# Patient Record
Sex: Female | Born: 1995 | Race: White | Hispanic: No | Marital: Single | Smoking: Former smoker
Health system: Southern US, Community
[De-identification: ages and names within clinical notes are randomized; demographics above are authoritative.]

## PROBLEM LIST (undated history)

## (undated) DIAGNOSIS — L301 Dyshidrosis [pompholyx]: Secondary | ICD-10-CM

## (undated) DIAGNOSIS — I341 Nonrheumatic mitral (valve) prolapse: Secondary | ICD-10-CM

## (undated) DIAGNOSIS — R000 Tachycardia, unspecified: Secondary | ICD-10-CM

## (undated) DIAGNOSIS — S06369A Traumatic hemorrhage of cerebrum, unspecified, with loss of consciousness of unspecified duration, initial encounter: Secondary | ICD-10-CM

## (undated) DIAGNOSIS — F329 Major depressive disorder, single episode, unspecified: Secondary | ICD-10-CM

## (undated) DIAGNOSIS — G43909 Migraine, unspecified, not intractable, without status migrainosus: Secondary | ICD-10-CM

## (undated) DIAGNOSIS — G901 Familial dysautonomia [Riley-Day]: Secondary | ICD-10-CM

## (undated) DIAGNOSIS — S022XXA Fracture of nasal bones, initial encounter for closed fracture: Secondary | ICD-10-CM

## (undated) DIAGNOSIS — F32A Depression, unspecified: Secondary | ICD-10-CM

## (undated) DIAGNOSIS — F419 Anxiety disorder, unspecified: Secondary | ICD-10-CM

## (undated) DIAGNOSIS — N946 Dysmenorrhea, unspecified: Secondary | ICD-10-CM

## (undated) HISTORY — PX: WISDOM TOOTH EXTRACTION: SHX21

## (undated) HISTORY — DX: Dyshidrosis (pompholyx): L30.1

## (undated) HISTORY — DX: Traumatic hemorrhage of cerebrum, unspecified, with loss of consciousness of unspecified duration, initial encounter: S06.369A

## (undated) HISTORY — DX: Dysmenorrhea, unspecified: N94.6

## (undated) HISTORY — DX: Fracture of nasal bones, initial encounter for closed fracture: S02.2XXA

---

## 2016-06-12 DIAGNOSIS — F411 Generalized anxiety disorder: Secondary | ICD-10-CM | POA: Insufficient documentation

## 2016-06-12 DIAGNOSIS — I059 Rheumatic mitral valve disease, unspecified: Secondary | ICD-10-CM | POA: Insufficient documentation

## 2017-06-26 DIAGNOSIS — G901 Familial dysautonomia [Riley-Day]: Secondary | ICD-10-CM | POA: Insufficient documentation

## 2018-01-21 ENCOUNTER — Encounter (HOSPITAL_COMMUNITY): Payer: Self-pay

## 2018-01-21 ENCOUNTER — Emergency Department (HOSPITAL_COMMUNITY): Payer: Federal, State, Local not specified - PPO

## 2018-01-21 ENCOUNTER — Other Ambulatory Visit: Payer: Self-pay

## 2018-01-21 ENCOUNTER — Emergency Department (HOSPITAL_COMMUNITY)
Admission: EM | Admit: 2018-01-21 | Discharge: 2018-01-21 | Disposition: A | Discharge status: Home / Self Care | Payer: Federal, State, Local not specified - PPO | Attending: Emergency Medicine | Admitting: Emergency Medicine

## 2018-01-21 DIAGNOSIS — Y990 Civilian activity done for income or pay: Secondary | ICD-10-CM | POA: Insufficient documentation

## 2018-01-21 DIAGNOSIS — S022XXA Fracture of nasal bones, initial encounter for closed fracture: Secondary | ICD-10-CM

## 2018-01-21 DIAGNOSIS — W01198A Fall on same level from slipping, tripping and stumbling with subsequent striking against other object, initial encounter: Secondary | ICD-10-CM | POA: Insufficient documentation

## 2018-01-21 DIAGNOSIS — W19XXXA Unspecified fall, initial encounter: Secondary | ICD-10-CM

## 2018-01-21 DIAGNOSIS — M25561 Pain in right knee: Secondary | ICD-10-CM | POA: Insufficient documentation

## 2018-01-21 DIAGNOSIS — Y939 Activity, unspecified: Secondary | ICD-10-CM | POA: Insufficient documentation

## 2018-01-21 DIAGNOSIS — Z23 Encounter for immunization: Secondary | ICD-10-CM | POA: Insufficient documentation

## 2018-01-21 DIAGNOSIS — Y9289 Other specified places as the place of occurrence of the external cause: Secondary | ICD-10-CM | POA: Insufficient documentation

## 2018-01-21 DIAGNOSIS — S0003XA Contusion of scalp, initial encounter: Secondary | ICD-10-CM | POA: Diagnosis not present

## 2018-01-21 DIAGNOSIS — Z79899 Other long term (current) drug therapy: Secondary | ICD-10-CM | POA: Diagnosis not present

## 2018-01-21 DIAGNOSIS — S0990XA Unspecified injury of head, initial encounter: Secondary | ICD-10-CM | POA: Diagnosis present

## 2018-01-21 HISTORY — DX: Nonrheumatic mitral (valve) prolapse: I34.1

## 2018-01-21 HISTORY — DX: Migraine, unspecified, not intractable, without status migrainosus: G43.909

## 2018-01-21 HISTORY — DX: Tachycardia, unspecified: R00.0

## 2018-01-21 HISTORY — DX: Major depressive disorder, single episode, unspecified: F32.9

## 2018-01-21 HISTORY — DX: Familial dysautonomia (riley-day): G90.1

## 2018-01-21 HISTORY — DX: Depression, unspecified: F32.A

## 2018-01-21 HISTORY — DX: Anxiety disorder, unspecified: F41.9

## 2018-01-21 MED ORDER — ACETAMINOPHEN 325 MG PO TABS
650.0000 mg | ORAL_TABLET | Freq: Once | ORAL | Status: AC
  Administered 2018-01-21: 650 mg via ORAL
  Filled 2018-01-21: qty 2

## 2018-01-21 MED ORDER — TETANUS-DIPHTH-ACELL PERTUSSIS 5-2.5-18.5 LF-MCG/0.5 IM SUSP
0.5000 mL | Freq: Once | INTRAMUSCULAR | Status: AC
  Administered 2018-01-21: 0.5 mL via INTRAMUSCULAR
  Filled 2018-01-21: qty 0.5

## 2018-01-21 NOTE — ED Provider Notes (Signed)
[facility]-EMERGENCY DEPT Provider Note   CSN: [id number] Arrival date & time: 01/21/18  0950     History   Chief Complaint Chief Complaint  Patient presents with  . Fall  . hematoma  . Facial Laceration    HPI Christina Wilson is a 22 y.o. female.  HPI   Christina Wilson is a 21yo female with a history of depression, anxiety, MVP, migraines who presents to the emergency department for evaluation of head injury.  Patient reports that she was at work at a [employer] store and accidentally tripped over a cord and hit the front of her head against the cash register.  This occurred around 915 this morning.  She denies loss of consciousness, does not take blood thinners.  States that she has an 8/10 severity left frontal headache which feels throbbing and pressure-like.  States that she initially had trouble with her vision, but this is improved.  States that she is not sure but may have some blurry vision in her left eye.  She also reports pain over the nasal bridge where there is a noted superficial laceration.  She is unsure of her last tetanus vaccination.  She also reports mild pain over her right knee, worse with knee flexion.  Patient denies nausea/vomiting, numbness, neck pain, back pain, chest pain, shortness of breath, abdominal pain, syncope.  States she feels a little bit lightheaded when she is walking and overall weak.  Past Medical History:  Diagnosis Date  . Anxiety   . Depression   . Dysautonomia (HCC)   . Migraine   . MVP (mitral valve prolapse)   . Tachycardia     There are no active problems to display for this patient.   Past Surgical History:  Procedure Laterality Date  . WISDOM TOOTH EXTRACTION       OB History   None      Home Medications    Prior to Admission medications   Medication Sig Start Date End Date Taking? Authorizing Provider  clonazePAM (KLONOPIN) 0.5 MG tablet Take 0.5 mg by mouth at bedtime. Pt reports also  taking 2 tablets during the day as needed for anxiety   Yes [provider]  divalproex (DEPAKOTE) 125 MG DR tablet Take 125 mg by mouth 3 (three) times daily.    Yes [provider]  metoprolol succinate (TOPROL-XL) 100 MG 24 hr tablet Take 100 mg by mouth daily. Take with or immediately following a meal.   Yes [provider]  sertraline (ZOLOFT) 50 MG tablet Take 50 mg by mouth daily.   Yes [provider]    Family History Family History  Problem Relation Age of Onset  . Diabetes Mother   . Mitral valve prolapse Mother   . Stroke Mother   . Atrial fibrillation Mother   . Sleep apnea Mother     Social History Social History   Tobacco Use  . Smoking status: Never Smoker  . Smokeless tobacco: Never Used  Substance Use Topics  . Alcohol use: Not Currently    Frequency: Never  . Drug use: Never     Allergies   Patient has no known allergies.   Review of Systems Review of Systems  Constitutional: Negative for chills and fever.  HENT: Positive for facial swelling (left scalp hematoma). Negative for nosebleeds.   Eyes: Positive for visual disturbance (left eye feels blurry).  Respiratory: Negative for shortness of breath.   Cardiovascular: Negative for chest pain.  Gastrointestinal: Negative  for abdominal pain, nausea and vomiting.  Genitourinary: Negative for difficulty urinating.  Musculoskeletal: Positive for arthralgias (right knee). Negative for back pain and neck pain.  Skin: Positive for wound (superficial laceration nose).  Neurological: Positive for headaches.  Psychiatric/Behavioral: Negative for agitation.     Physical Exam Updated Vital Signs BP 123/87 (BP Location: Left Arm)   Pulse 79   Temp 98.1 F (36.7 C) (Oral)   Resp 18   Ht 5\' 2"  (1.575 m)   Wt 42.2 kg (93 lb)   SpO2 100%   BMI 17.01 kg/m   Physical Exam  Constitutional: She is oriented to person, place, and time. She appears well-developed and  well-nourished. No distress.  HENT:  Head: Normocephalic.  No racoon eyes or battle sign. Bilateral TMs with good cone of light, no hemotympanum. Right frontal scalp hematoma which is 4cm in diameter and tender to the touch. 1cm superficial laceration over the nasal bridge with overlying tenderness, no active bleeding. No nasal deformity or nasal hematoma.  Mucous memories moist.  Airway patent.   Eyes: Pupils are equal, round, and reactive to light. Conjunctivae and EOM are normal. Right eye exhibits no discharge. Left eye exhibits no discharge.  Neck: Normal range of motion. Neck supple.  No midline cervical spine tenderness.  Cardiovascular: Normal rate and regular rhythm. Exam reveals no friction rub.  No murmur heard. Mid-systolic click.  Pulmonary/Chest: Effort normal and breath sounds normal. No stridor. No respiratory distress. She has no wheezes. She has no rales.  Abdominal: Soft. Bowel sounds are normal. There is no tenderness. There is no guarding.  Musculoskeletal:  No midline T-spine or L-spine tenderness. Right knee with tenderness to palpation of medial aspect of patella with overlying ecchymosis, no break in skin. No joint effusion or swelling.  Full active flexion/extension.  appreciated. No abnormal alignment or patellar mobility. No varus/valgus laxity. Negative drawer's. 2+ DP pulses bilaterally. All compartments are soft. Sensation intact distal to injury.  Neurological: She is alert and oriented to person, place, and time. Coordination normal.  Mental Status:  Alert, oriented, thought content appropriate, able to give a coherent history. Speech fluent without evidence of aphasia. Able to follow 2 step commands without difficulty.  Cranial Nerves:  II:  Peripheral visual fields grossly normal, pupils equal, round, reactive to light III,IV, VI: ptosis not present, extra-ocular motions intact bilaterally  V,VII: smile symmetric, facial light touch sensation equal VIII:  hearing grossly normal to voice  X: uvula elevates symmetrically  XI: bilateral shoulder shrug symmetric and strong XII: midline tongue extension without fassiculations Motor:  Normal tone. 5/5 in upper and lower extremities bilaterally including strong and equal grip strength and dorsiflexion/plantar flexion Sensory: Pinprick and light touch normal in all extremities.   Skin: Skin is warm and dry. Capillary refill takes less than 2 seconds. She is not diaphoretic.  Psychiatric: She has a normal mood and affect. Her behavior is normal.  Nursing note and vitals reviewed.    ED Treatments / Results  Labs (all labs ordered are listed, but only abnormal results are displayed) Labs Reviewed - No data to display  EKG None  Radiology Ct Head Wo Contrast  Result Date: 01/21/2018 CLINICAL DATA:  Trip and fall with facial injury, initial encounter EXAM: CT HEAD WITHOUT CONTRAST CT MAXILLOFACIAL WITHOUT CONTRAST TECHNIQUE: Multidetector CT imaging of the head and maxillofacial structures were performed using the standard protocol without intravenous contrast. Multiplanar CT image reconstructions of the maxillofacial structures were also generated. COMPARISON:  None. FINDINGS: CT HEAD FINDINGS Brain: Ventricles are normal size and configuration. There is some mild foci of increased attenuation identified in the left frontal lobe at the gray-white matter junction which given the recent injury may represent a mild shear injury and concussion. No findings to suggest acute infarct are noted. Vascular: No hyperdense vessel or unexpected calcification. Skull: Normal. Negative for fracture or focal lesion. Other: Left frontal scalp hematoma is noted. CT MAXILLOFACIAL FINDINGS Osseous: Mildly displaced nasal bone fracture is noted. No other bony abnormality is seen. Orbits: Orbits and their contents are within normal limits. Sinuses: Paranasal sinuses are well aerated without air-fluid levels. Soft tissues: Left  frontal scalp hematoma is noted extending inferiorly to the left periorbital region. No other significant soft tissue abnormality is noted. IMPRESSION: CT of the head: Changes consistent with a mild shear injury and some parenchymal hemorrhage in the posterior left frontal lobe. Follow-up imaging is recommended. CT of the maxillofacial bones: Nasal bone fracture and mild left facial swelling as described. Electronically Signed   By: Alcide CleverMark  Lukens M.D.   On: 01/21/2018 13:26   Dg Knee Complete 4 Views Right  Result Date: 01/21/2018 CLINICAL DATA:  The patient tripped over cord and fell today resulting in a right knee injury. Pain. Initial encounter. EXAM: RIGHT KNEE - COMPLETE 4+ VIEW COMPARISON:  None. FINDINGS: No evidence of fracture, dislocation, or joint effusion. No evidence of arthropathy or other focal bone abnormality. Soft tissues are unremarkable. IMPRESSION: Normal exam. Electronically Signed   By: Drusilla Kannerhomas  Dalessio M.D.   On: 01/21/2018 13:23   Ct Maxillofacial Wo Contrast  Result Date: 01/21/2018 CLINICAL DATA:  Trip and fall with facial injury, initial encounter EXAM: CT HEAD WITHOUT CONTRAST CT MAXILLOFACIAL WITHOUT CONTRAST TECHNIQUE: Multidetector CT imaging of the head and maxillofacial structures were performed using the standard protocol without intravenous contrast. Multiplanar CT image reconstructions of the maxillofacial structures were also generated. COMPARISON:  None. FINDINGS: CT HEAD FINDINGS Brain: Ventricles are normal size and configuration. There is some mild foci of increased attenuation identified in the left frontal lobe at the gray-white matter junction which given the recent injury may represent a mild shear injury and concussion. No findings to suggest acute infarct are noted. Vascular: No hyperdense vessel or unexpected calcification. Skull: Normal. Negative for fracture or focal lesion. Other: Left frontal scalp hematoma is noted. CT MAXILLOFACIAL FINDINGS Osseous:  Mildly displaced nasal bone fracture is noted. No other bony abnormality is seen. Orbits: Orbits and their contents are within normal limits. Sinuses: Paranasal sinuses are well aerated without air-fluid levels. Soft tissues: Left frontal scalp hematoma is noted extending inferiorly to the left periorbital region. No other significant soft tissue abnormality is noted. IMPRESSION: CT of the head: Changes consistent with a mild shear injury and some parenchymal hemorrhage in the posterior left frontal lobe. Follow-up imaging is recommended. CT of the maxillofacial bones: Nasal bone fracture and mild left facial swelling as described. Electronically Signed   By: Alcide CleverMark  Lukens M.D.   On: 01/21/2018 13:26    Procedures Procedures (including critical care time)  Medications Ordered in ED Medications  Tdap (BOOSTRIX) injection 0.5 mL (0.5 mLs Intramuscular Given 01/21/18 1247)  acetaminophen (TYLENOL) tablet 650 mg (650 mg Oral Given 01/21/18 1248)     Initial Impression / Assessment and Plan / ED Course  I have reviewed the triage vital signs and the nursing notes.  Pertinent labs & imaging results that were available during my care of the patient were reviewed  by me and considered in my medical decision making (see chart for details).     CT scan of the heart reveals changes consistent with mild shear injury and parenchymal hemorrhage in the posterior left frontal lobe.  CT maxillofacial reveals nasal bone fracture.  On exam no neurological deficits. Discussed this patient with neurosurgeon Dr. Franky Macho who reviewed patient's CT scan and does does not believe that she has an intracerebral abnormality or hemorrhage.  He recommends that someone be with her for the next 24 hours for monitoring and that she return if she has any new or worsening symptoms.  He states that she does not need CT follow-up.  Cut on patient's nose is superficial, no active bleeding.  No indication for repair.  Her tetanus was  updated in the emergency department today.  Have counseled her regarding Dr. Sueanne Margarita recommendations.  She agrees and feels comfortable with this plan.  X-ray right knee without acute abnormality.  Have counseled her on RICE protocol and Tylenol for pain.   Final Clinical Impressions(s) / ED Diagnoses   Final diagnoses:  Hematoma of scalp, initial encounter  Fall, initial encounter  Closed fracture of nasal bone, initial encounter    ED Discharge Orders    None       Lawrence Marseilles 01/22/18 1610    Raeford Razor, MD 01/22/18 1311

## 2018-01-21 NOTE — ED Notes (Signed)
Pt is alert and oriented x 4 and is verbally responsive. Pt reports 8/10 left sided head pain throbbing and pressure. Pt reports that she has some lightheadedness, and blurred vision. Pt has lac noted to bridge of her nose and swelling and redness to left side of forehead.

## 2018-01-21 NOTE — Progress Notes (Signed)
Patient ID: Grant FontanaCatherine Pamintuan, female   DOB: [birth year], 24 y.o.   MRN: [id number] BP 124/81 (BP Location: Right Arm)   Pulse 83   Temp 98.1 F (36.7 C) (Oral)   Resp 18   Ht 5\' 2"  (1.575 m)   Wt 42.2 kg (93 lb)   LMP 01/07/2018   SpO2 100%   BMI 17.01 kg/m  I have reviewed her films and do not believe that she does have an intracerebral abnormality. I do believe she should be with someone for ~24 hours. No need for CT followup.

## 2018-01-21 NOTE — Discharge Instructions (Signed)
CT scan of your head was reassuring. It did show that you have a broken nose. Place ice on the head to help with swelling of the hematoma on your scalp. You can take tylenol at home for any headache you have  Please have someone with you overnight to monitor you. Return to the ER if you have any new or concerning symptoms like worsening headache, vomiting, trouble with your vision, numbness.

## 2018-01-21 NOTE — ED Triage Notes (Signed)
Patient c/o tripping on a cord at work and falling into a [employer] register at [id number]. Patient has a hematoma to the forehead and a small laceration to the bridge of her nose. Patient states she has a small amount of blurred vision and feels weak. Patient denies any use of blood thinners.

## 2018-01-30 ENCOUNTER — Encounter: Payer: Self-pay | Admitting: Physician Assistant

## 2018-01-30 ENCOUNTER — Ambulatory Visit: Payer: Worker's Compensation | Admitting: Physician Assistant

## 2018-01-30 VITALS — BP 113/72 | HR 72 | Temp 98.1°F | Wt 96.0 lb

## 2018-01-30 DIAGNOSIS — S06350D Traumatic hemorrhage of left cerebrum without loss of consciousness, subsequent encounter: Secondary | ICD-10-CM

## 2018-01-30 DIAGNOSIS — R292 Abnormal reflex: Secondary | ICD-10-CM | POA: Diagnosis not present

## 2018-01-30 DIAGNOSIS — F32A Depression, unspecified: Secondary | ICD-10-CM

## 2018-01-30 DIAGNOSIS — Z7689 Persons encountering health services in other specified circumstances: Secondary | ICD-10-CM | POA: Diagnosis not present

## 2018-01-30 DIAGNOSIS — S062X0D Diffuse traumatic brain injury without loss of consciousness, subsequent encounter: Secondary | ICD-10-CM | POA: Diagnosis not present

## 2018-01-30 DIAGNOSIS — F329 Major depressive disorder, single episode, unspecified: Secondary | ICD-10-CM

## 2018-01-30 DIAGNOSIS — S022XXD Fracture of nasal bones, subsequent encounter for fracture with routine healing: Secondary | ICD-10-CM | POA: Diagnosis not present

## 2018-01-30 DIAGNOSIS — G43009 Migraine without aura, not intractable, without status migrainosus: Secondary | ICD-10-CM

## 2018-01-30 DIAGNOSIS — S06369A Traumatic hemorrhage of cerebrum, unspecified, with loss of consciousness of unspecified duration, initial encounter: Secondary | ICD-10-CM

## 2018-01-30 DIAGNOSIS — S022XXA Fracture of nasal bones, initial encounter for closed fracture: Secondary | ICD-10-CM

## 2018-01-30 DIAGNOSIS — S0636AA Traumatic hemorrhage of cerebrum, unspecified, with loss of consciousness status unknown, initial encounter: Secondary | ICD-10-CM

## 2018-01-30 HISTORY — DX: Fracture of nasal bones, initial encounter for closed fracture: S02.2XXA

## 2018-01-30 HISTORY — DX: Traumatic hemorrhage of cerebrum, unspecified, with loss of consciousness status unknown, initial encounter: S06.36AA

## 2018-01-30 HISTORY — DX: Traumatic hemorrhage of cerebrum, unspecified, with loss of consciousness of unspecified duration, initial encounter: S06.369A

## 2018-01-30 NOTE — Patient Instructions (Signed)
Traumatic Brain Injury Traumatic brain injury (TBI) is an injury to your brain from a blow to your head (closed injury) or an object penetrating your skull and entering your brain (open injury). The severity of TBI varies significantly from one person to the next. Some TBIs cause you to pass out (lose consciousness) immediately and for a long period of time. Other TBIs do not cause any loss of consciousness. Symptoms of any type of TBI can be long lasting (chronic). TBI can interfere with memory and speech. TBI can also cause chronic symptoms like headache or dizziness. What are the causes? TBI is caused by a closed or open injury. What increases the risk? You may be at higher risk for TBI if you:  Are 75 or older.  Are a man.  Are in a car accident.  Play a contact sport, especially football, hockey, or soccer.  Do not wear protective gear while playing sports.  Are in the military.  Are a victim of violence.  Abuse drugs or alcohol.  Have had a previous TBI.  What are the signs or symptoms? Signs and symptoms of TBI may occur right away or not until days, weeks, or months after the injury. They may last for days, weeks, months, or years. Symptoms may include:  Loss of consciousness.  Headache.  Confusion.  Fatigue.  Changes in sleep.  Dizziness.  Mood or personality changes.  Memory problems.  Nausea or vomiting or both.  Seizures.  Clumsiness.  Slurred speech.  Depression and anxiety.  Anger.  Inability to control emotions or actions (impulse control).  Loss of or dulling of your senses, such as hearing, vision, and touch. This can include: ? Blurred vision. ? Ringing in your ears.  How is this diagnosed? TBI may be diagnosed by a medical history and physical exam. Your health care provider will also do a neurologic exam to check your:  Reflexes.  Sensations.  Alertness.  Memory.  Vision.  Hearing.  Coordination.  Your health care  provider will also do tests to diagnose the extent of your TBI, such as a CT scan of your brain and skull. One way to determine the severity of your TBI is with a scoring system called the Glasgow Coma Scale (GCS). It measures eye opening, motor response, and verbal response. The higher the score, the milder the TBI. Your TBI may be described as mild, moderate, or severe:  Mild TBI (concussion). ? Symptoms of mild TBI usually go away on their own. This can take weeks or months, depending on the type of concussion. ? Your GCS will be 13-15. ? Your brain CT scan will be normal. ? You may or may not have a short hospital stay.  Moderate TBI. ? Your GCS will be 9-12. ? Your brain CT scan will be abnormal. ? You will likely need a short hospital stay.  Severe TBI. ? Your GCS will be 3-8. ? Your brain CT scan will be abnormal. ? You may need a long stay in the hospital.  How is this treated? Emergency treatment of TBI may involve measures to maintain a clear airway and stable blood pressure. Brain surgery may be needed to:  Remove a blood clot.  Repair bleeding.  Remove an object that has penetrated the brain, such as a skull fragment or a bullet.  Other treatments depend on your chronic signs and symptoms. These treatments include the following types of therapy:  Physical.  Occupational.  Speech and language.  Mental health.    Social support.  Follow these instructions at home:  Carefully follow all your health care provider's instructions.  Work closely with all your therapists, if necessary.  Take medicines only as directed by your health care provider. Do not take aspirin or other anti-inflammatory medicines such as ibuprofen or naproxen unless approved by your health care provider.  Do not abuse illegal drugs.  Limit alcohol intake to no more than 1 drink per day for nonpregnant women and 2 drinks per day for men. One drink equals 12 ounces of beer, 5 ounces of wine,  or 1 ounces of hard liquor.  Avoid any situation where there is potential for another head injury, such as football, hockey, soccer, basketball, martial arts, downhill snow sports, and horseback riding. Do not do these activities until your health care provider approves.  Rest. Rest helps the brain to heal. Make sure you: ? Get plenty of sleep at night. Avoid staying up late at night. ? Keep the same bedtime hours on weekends and weekdays. ? Rest during the day. Take daytime naps or rest breaks when you feel tired.  Avoid excessive visual stimulation while recovering from a TBI. This includes work on the computer, watching TV, and reading.  Try to avoid activities that cause physical or mental stress. Stay home from work or school as directed by your health care provider.  Make lists to plan your day and help your memory.  Do not drive, ride a bicycle, or operate heavy machinery until your health care provider approves.  Seek support from friends and family.  Keep all follow-up visits as directed by your health care provider. This is important.  Watch your symptoms and tell others to do the same. Complications sometimes occur after a TBI. How is this prevented?  Wear a helmet while biking, skiing, skateboarding, skating, or doing similar activities. Wear your seatbelt while driving.  Do not abuse alcohol or drugs.  Do not drink and drive.  Prevent falls at home by: ? Removing clutter and tripping hazards, including loose rugs, from floors and stairways. ? Using grab bars in bathrooms and handrails by stairs. ? Placing nonslip mats on floors and in bathtubs. ? Improving lighting in dim areas. Contact a health care provider if: Seek medical care if you have any of the following symptoms for more than two weeks after your injury:  Chronic headaches.  Dizziness or balance problems.  Nausea.  Vision problems.  Increased sensitivity to noise or light.  Depression or mood  swings.  Anxiety or irritability.  Memory problems.  Difficulty concentrating or paying attention.  Sleep problems.  Feeling tired all the time.  Get help right away if:  You have confusion or unusual drowsiness.  It is difficult to wake you up.  You have nausea or persistent, forceful vomiting.  You feel like you are moving when you are not (vertigo). Your eyes may move rapidly back and forth.  You have convulsions or faint.  You have severe, persistent headaches that are not relieved by medicine.  You cannot use your arms or legs normally.  One of your pupils is larger than the other.  You have clear or bloody discharge from your nose or ears.  Your problems are getting worse, not better. This information is not intended to replace advice given to you by your health care provider. Make sure you discuss any questions you have with your health care provider. Document Released: 07/21/2002 Document Revised: 04/02/2016 Document Reviewed: 11/13/2013 [employer] Interactive Patient Education    2018 [employer]  

## 2018-01-30 NOTE — Progress Notes (Signed)
HPI:                                                                Christina Wilson is a 22 y.o. female who [facility] today to establish care  Current concerns:  Recent fall and head trauma at work on 01/21/18. CT head showed mild shear injury and parenchymal hemorrhage of the posterior left frontal lobe and CT maxillofacial showed fracture of the nasal bone and left frontal scalp hematoma. Neurosurgery was consulted and recommended close surveillance. Did not recommend follow-up imaging.  Reports she has returned to work, but is having a lot of difficulty completing her job. 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 Since the injury she continues to have headaches, fatigue, lethargy, sleeping more than usual, intermittent blurred vision, balance problems, dizziness, feeling mentally foggy, trouble concentrating, memory difficulties, slowed thinking, sadness/feeling more emotional. Has paperwork with her today for accommodations at work.  Nose appears shifted to the right per patient. Reports burning bilaterally with deep inhalation through the nose. No nosebleeds. Sense of smell intact.    Depression screen PHQ 2/9 01/31/2018  Decreased Interest 1  Down, Depressed, Hopeless 1  PHQ - 2 Score 2  Altered sleeping 2  Tired, decreased energy 3  Change in appetite 3  Feeling bad or failure about yourself  0  Trouble concentrating 3  Moving slowly or fidgety/restless 3  Suicidal thoughts 0  PHQ-9 Score 16    GAD 7 : Generalized Anxiety Score 01/31/2018  Nervous, Anxious, on Edge 1  Control/stop worrying 3  Worry too much - different things 3  Trouble relaxing 3  Restless 3  Easily annoyed or irritable 3  Afraid - awful might happen 0  Total GAD 7 Score 16      Past Medical History:  Diagnosis Date  . Anxiety   . Closed fracture of nasal bone 01/30/2018  . Depression   .  Dysautonomia (HCC)   . Dyshidrotic eczema   . Dysmenorrhea   . Migraine   . MVP (mitral valve prolapse)   . Tachycardia   . 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 WISDOM TOOTH EXTRACTION     Social History   Tobacco Use  . Smoking status: Former Smoker    Types: Cigarettes    Last attempt to quit: 03/10/2014    Years since quitting: 3.8  . Smokeless tobacco: Never Used  . Tobacco comment: 2 cigs/day  Substance Use Topics  . Alcohol use: Never    Frequency: Never   family history includes Anxiety disorder in her mother; Atrial fibrillation in her mother; Depression in her mother; Diabetes in her mother; Mitral valve prolapse in her mother; Sleep apnea in her mother; Stroke in her mother.    ROS: Review of Systems  Constitutional: Positive for chills, malaise/fatigue and weight loss.  Cardiovascular: Positive for chest pain and palpitations.  Musculoskeletal: Positive for falls and joint pain.  Skin: Positive for rash (eczema).  Neurological: Positive for dizziness and headaches.  Endo/Heme/Allergies:       + cold intolerance  Psychiatric/Behavioral: Positive  for depression. The patient is nervous/anxious.      Medications: Current Outpatient Medications  Medication Sig Dispense Refill  . clonazePAM (KLONOPIN) 0.5 MG tablet Take 0.5 mg by mouth at bedtime. Pt reports also taking 2 tablets during the day as needed for anxiety    . divalproex (DEPAKOTE) 125 MG DR tablet Take 125 mg by mouth 3 (three) times daily.     . hyoscyamine (ANASPAZ) 0.125 MG TBDP disintergrating tablet Place 0.125 mg under the tongue.    . metoprolol succinate (TOPROL-XL) 100 MG 24 hr tablet Take 100 mg by mouth daily. Take with or immediately following a meal.    . sertraline (ZOLOFT) 50 MG tablet Take 50 mg by mouth daily.     No current facility-administered medications for this visit.     No Known Allergies     Objective:  BP 113/72   Pulse 72   Temp 98.1 F (36.7 C) (Oral)   Wt 96 lb (43.5 kg)   LMP 01/29/2018   BMI 17.56 kg/m  Gen: well-groomed, not ill-appearing, no acute distress HEENT: head normocephalic, yellow ecchymoses of the left forehead and left upper eyelid; nasal bone deviated slightly to the right, nares are patent, conjunctiva and cornea clear, bilateral fasciculation of the upper eyelids, oropharynx clear, moist mucus membranes; neck supple, no spinous process tenderness Pulm: Normal work of breathing, normal phonation Neuro:  cranial nerves II-XII intact, no nystagmus, normal finger-to-nose, normal heel-to-shin, negative pronator drift, normal rapid alternating movements, DTR's intact, hyperreflexic, normal tone, no tremor MSK: strength 5/5 and symmetric in bilateral upper and lower extremities, normal gait and station, negative Romberg Mental Status: alert and oriented x 3, speech articulate, and thought processes clear and goal-directed  Study Result   CLINICAL DATA:  Trip and fall with facial injury, initial encounter  EXAM: CT HEAD WITHOUT CONTRAST  CT MAXILLOFACIAL WITHOUT CONTRAST  TECHNIQUE: Multidetector CT imaging of the head and maxillofacial structures were performed using the standard protocol without intravenous contrast. Multiplanar CT image reconstructions of the maxillofacial structures were also generated.  COMPARISON:  None.  FINDINGS: CT HEAD FINDINGS  Brain: Ventricles are normal size and configuration. There is some mild foci of increased attenuation identified in the left frontal lobe at the gray-white matter junction which given the recent injury may represent a mild shear injury and concussion. No findings to suggest acute infarct are noted.  Vascular: No hyperdense vessel or unexpected calcification.  Skull: Normal. Negative for fracture or focal lesion.  Other: Left frontal scalp hematoma is  noted.  CT MAXILLOFACIAL FINDINGS  Osseous: Mildly displaced nasal bone fracture is noted. No other bony abnormality is seen.  Orbits: Orbits and their contents are within normal limits.  Sinuses: Paranasal sinuses are well aerated without air-fluid levels.  Soft tissues: Left frontal scalp hematoma is noted extending inferiorly to the left periorbital region. No other significant soft tissue abnormality is noted.  IMPRESSION: CT of the head: Changes consistent with a mild shear injury and some parenchymal hemorrhage in the posterior left frontal lobe. Follow-up imaging is recommended.  CT of the maxillofacial bones: Nasal bone fracture and mild left facial swelling as described.   Electronically Signed   By: Alcide Clever M.D.   On: 01/21/2018 13:26    Study Result   CLINICAL DATA:  Trip and fall with facial injury, initial encounter  EXAM: CT HEAD WITHOUT CONTRAST  CT MAXILLOFACIAL WITHOUT CONTRAST  TECHNIQUE: Multidetector CT imaging of the head and maxillofacial structures were performed  using the standard protocol without intravenous contrast. Multiplanar CT image reconstructions of the maxillofacial structures were also generated.  COMPARISON:  None.  FINDINGS: CT HEAD FINDINGS  Brain: Ventricles are normal size and configuration. There is some mild foci of increased attenuation identified in the left frontal lobe at the gray-white matter junction which given the recent injury may represent a mild shear injury and concussion. No findings to suggest acute infarct are noted.  Vascular: No hyperdense vessel or unexpected calcification.  Skull: Normal. Negative for fracture or focal lesion.  Other: Left frontal scalp hematoma is noted.  CT MAXILLOFACIAL FINDINGS  Osseous: Mildly displaced nasal bone fracture is noted. No other bony abnormality is seen.  Orbits: Orbits and their contents are within normal limits.  Sinuses:  Paranasal sinuses are well aerated without air-fluid levels.  Soft tissues: Left frontal scalp hematoma is noted extending inferiorly to the left periorbital region. No other significant soft tissue abnormality is noted.  IMPRESSION: CT of the head: Changes consistent with a mild shear injury and some parenchymal hemorrhage in the posterior left frontal lobe. Follow-up imaging is recommended.  CT of the maxillofacial bones: Nasal bone fracture and mild left facial swelling as described.   Electronically Signed   By: Alcide Clever M.D.   On: 01/21/2018 13:26     No results found for this or any previous visit (from the past 72 hour(s)). No results found.    Assessment and Plan: 22 y.o. 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, subsequent encounter  Closed fracture of nasal bone with routine healing, subsequent encounter - Plan: Ambulatory referral to ENT  Generalized hyperreflexia  Encounter to establish care  Migraine without aura and without status migrainosus, not intractable  Depression, unspecified depression type  - Personally reviewed PMH, PSH, PFH, medications, allergies, HM - Age-appropriate cancer screening: Pap UTD per patient, records are in [facility] - Influenza n/a - Tdap UTD  - PHQ2=2, active treatment plan for depression and anxiety  TBI / Concussion - personally reviewed CT Head and CT maxillofacial from 01/21/18 - patient is still very symptomatic. Reassuring neuro exam. Generalized hyperreflexia, however patient reports she has always had brisk reflexes at baseline Due to history of headache disorder and depression she is at increased risk for prolonged post-concussive syndrome - forms completed for work accommodations today. Specifically limitied schedule 4-6 hours per day, no lifting>2 pounds, no climbing or heights - patient advised to avoid all strenous physical activity - close follow-up with  Sports Medicine in 1 week - referring to ENT for nasal fracture  Patient education and anticipatory guidance given Patient agrees with treatment plan Follow-up in 1 week with Sports Medicine for concussion or sooner as needed if symptoms worsen or fail to improve  Levonne Hubert PA-C

## 2018-01-31 ENCOUNTER — Encounter: Payer: Self-pay | Admitting: Physician Assistant

## 2018-01-31 DIAGNOSIS — R292 Abnormal reflex: Secondary | ICD-10-CM | POA: Insufficient documentation

## 2018-01-31 DIAGNOSIS — N946 Dysmenorrhea, unspecified: Secondary | ICD-10-CM | POA: Insufficient documentation

## 2018-01-31 DIAGNOSIS — F39 Unspecified mood [affective] disorder: Secondary | ICD-10-CM | POA: Insufficient documentation

## 2018-01-31 DIAGNOSIS — G43009 Migraine without aura, not intractable, without status migrainosus: Secondary | ICD-10-CM | POA: Insufficient documentation

## 2018-01-31 DIAGNOSIS — L301 Dyshidrosis [pompholyx]: Secondary | ICD-10-CM | POA: Insufficient documentation

## 2018-02-01 ENCOUNTER — Encounter: Payer: Self-pay | Admitting: Physician Assistant

## 2018-02-01 ENCOUNTER — Telehealth: Payer: Self-pay | Admitting: Physician Assistant

## 2018-02-01 NOTE — Telephone Encounter (Signed)
I actually only completed paperwork for her to work a reduced schedule with accommodations. I have written a work note for her to be completely excused until her follow-up appointment with the Sports doctor, who can clear her to return

## 2018-02-01 NOTE — Telephone Encounter (Signed)
Patient was seen this past Wed for a post fall head injury. You had written her out of work up until today but she is requesting to be written out of work for a short time longer as she still does not seem to be any better. Please advise.

## 2018-02-05 ENCOUNTER — Ambulatory Visit (INDEPENDENT_AMBULATORY_CARE_PROVIDER_SITE_OTHER): Payer: Federal, State, Local not specified - PPO | Admitting: Family Medicine

## 2018-02-05 ENCOUNTER — Encounter: Payer: Self-pay | Admitting: Family Medicine

## 2018-02-05 VITALS — BP 125/83 | HR 74 | Ht 62.01 in | Wt 95.0 lb

## 2018-02-05 DIAGNOSIS — G44319 Acute post-traumatic headache, not intractable: Secondary | ICD-10-CM

## 2018-02-05 DIAGNOSIS — S062X0D Diffuse traumatic brain injury without loss of consciousness, subsequent encounter: Secondary | ICD-10-CM | POA: Diagnosis not present

## 2018-02-05 DIAGNOSIS — S06350D Traumatic hemorrhage of left cerebrum without loss of consciousness, subsequent encounter: Secondary | ICD-10-CM

## 2018-02-05 MED ORDER — MECLIZINE HCL 25 MG PO TABS: 25.0000 mg | ORAL_TABLET | Freq: Three times a day (TID) | ORAL | 3 refills | Status: DC | PRN

## 2018-02-05 NOTE — Patient Instructions (Addendum)
Thank you for coming in today. You should hear from Neurology soon.  I think we will likely start Topomax for headache.  You should hear about MRI soon.   Call [facility]   Sent referral with ov notes and insurance to [facility] at [phone] and [phone]. They will call and schedule with patient for good appointment time. - CF     Recheck with me next after MRI.   Out of work for a few weeks.   Use meclizine for severe dizzy for headache.     Concussion, Adult A concussion is a brain injury from a direct hit (blow) to the head or body. This injury causes the brain to shake quickly back and forth inside the skull. It is caused by:  A hit to the head.  A quick and sudden movement (jolt) of the head or neck.  How fast you will get better from a concussion depends on many things like how bad your concussion was, what part of your brain was hurt, how old you are, and how healthy you were before the concussion. Recovery can take time. It is important to wait to return to activity until a doctor says it is safe and your symptoms are all gone. Follow these instructions at home: Activity  Limit activities that need a lot of thought or concentration. These include: ? Homework or work for your job. ? Watching TV. ? Computer work. ? Playing memory games and puzzles.  Rest. Rest helps the brain to heal. Make sure you: ? Get plenty of sleep at night. Do not stay up late. ? Go to bed at the same time every day. ? Rest during the day. Take naps or rest breaks when you feel tired.  It can be dangerous if you get another concussion before the first one has healed Do not do activities that could cause a second concussion, such as riding a bike or playing sports.  Ask your doctor when you can return to your normal activities, like driving, riding a bike, or using machinery. Your ability to react may be slower. Do not do these activities if you are dizzy. Your doctor will likely give you a  plan for slowly going back to activities. General instructions  Take over-the-counter and prescription medicines only as told by your doctor.  Do not drink alcohol until your doctor says you can.  If it is harder than usual to remember things, write them down.  If you are easily distracted, try to do one thing at a time. For example, do not try to watch TV while making dinner.  Talk with family members or close friends when you need to make important decisions.  Watch your symptoms and tell other people to do the same. Other problems (complications) can happen after a concussion. Older adults with a brain injury may have a higher risk of serious problems, such as a blood clot in the brain.  Tell your teachers, school nurse, school counselor, coach, Event organiserathletic trainer, or work Production designer, theatre/television/filmmanager about your injury and symptoms. Tell them about what you can or cannot do. They should watch for: ? More problems with attention or concentration. ? More trouble remembering or learning new information. ? More time needed to do tasks or assignments. ? Being more annoyed (irritable) or having a harder time dealing with stress. ? Any other symptoms that get worse.  Keep all follow-up visits as told by your health care provider. This is important. Prevention  It is very important  that you donot get another brain injury, especially before you have healed. In rare cases, another injury can cause permanent brain damage, brain swelling, or death. You have the most risk if you get another head injury in the first 7-10 days after you were hurt before. To avoid injuries: ? Wear a seat belt when you ride in a car. ? Do not drink too much alcohol. ? Avoid activities that could make you get a second concussion, like contact sports. ? Wear a helmet when you do activities like:  Biking.  Skiing.  Skateboarding.  Skating. ? Make your home safe by:  Removing things from the floor or stairs that could make you  trip.  Using grab bars in bathrooms and handrails by stairs.  Placing non-slip mats on floors and in bathtubs.  Putting more light in dark areas. Contact a doctor if:  Your symptoms get worse.  You have new symptoms.  You keep having symptoms for more than 2 weeks. Get help right away if:  You have bad headaches, or your headaches get worse.  You have weakness in any part of your body.  You have loss of feeling (numbness).  You feel off balance.  You keep throwing up (vomiting).  You feel more sleepy.  The black center of one eye (pupil) is bigger than the other one.  You twitch or shake violently (convulse) or have a seizure.  Your speech is not clear (is slurred).  You feel more tired, more confused, or more annoyed.  You do not recognize people or places.  You have neck pain.  It is hard to wake you up.  You have strange behavior changes.  You pass out (lose consciousness). Summary  A concussion is a brain injury from a direct hit (blow) to the head or body.  This condition is treated with rest and careful watching of symptoms.  If you keep having symptoms for more than 2 weeks, call your doctor. This information is not intended to replace advice given to you by your health care provider. Make sure you discuss any questions you have with your health care provider. Document Released: 07/19/2009 Document Revised: 07/15/2016 Document Reviewed: 07/15/2016 Elsevier Interactive Patient Education  2017 [employer].

## 2018-02-05 NOTE — Progress Notes (Addendum)
Subjective:    I'm seeing this patient as a consultation for:  Christina Stable, PA-C   CC: Traumatic brain injury  HPI: Christina Wilson was in her normal state of health on June 10 at work.  She fell at work and struck the left side of her face and head against the cast register.  She was seen in the emergency room department where she was found to have nasal bone fracture as well as shear injury and parenchymal hemorrhage of posterior left frontal lobe seen on CT scan.  She followed up with her primary care provider on June 19 where she was referred to me.  She notes that she remains quite symptomatic complaining of headache pressure and had nausea dizziness blurry vision difficulty with balance sensitivity to light feeling slowed down foggy not feeling right difficulty concentrating remembering fatigue low injury confusion drowsiness emotional irritable and having some trouble sleeping.  She is been unable to work or drive since the accident.  She notes her biggest issue now is headache.  She notes left-sided head pressure and headache.  She has a pre-existing diagnosis of migraine disorder which typically has been pretty well controlled.  She has been using Tylenol for headaches which are only mildly helpful.     Past medical history, Surgical history, Family history not pertinant except as noted below, Social history, Allergies, and medications have been entered into the medical record, reviewed, and no changes needed.   Review of Systems: No headache, visual changes, nausea, vomiting, diarrhea, constipation, dizziness, abdominal pain, skin rash, fevers, chills, night sweats, weight loss, swollen lymph nodes, body aches, joint swelling, muscle aches, chest pain, shortness of breath, mood changes, visual or auditory hallucinations.   Objective:    Vitals:   02/05/18 1305  BP: 125/83  Pulse: 74  SpO2: 100%  Gen: Well NAD HEENT: EOMI,  MMM, normal funduscopic exam  bilaterally Lungs: CTABL Nl WOB Heart: RRR no MRG Abd: NABS, NT, ND Exts: Non edematous BL  LE, warm and well perfused.  Neuro alert and oriented  SCAT5: Total number of symptoms:  21/22 Symptom severity score:  110/132 Cognitive assessment: 4/5 (missed date) Immediate memory score: 14/15 Concentration score:  4/5 Neck exam:    NL Balance exam:   Significant abnormalities with single leg and tandem stance.  Mild abnormalities with stance Coordination exam:  Normal Delayed recall score  3/5  Patient has significant increase in her accommodation distance greater than 10 cm She has significant difficulties with rapid saccades and smooth tracking especially horizontally       Lab and Radiology Results EXAM: CT HEAD WITHOUT CONTRAST  CT MAXILLOFACIAL WITHOUT CONTRAST  TECHNIQUE: Multidetector CT imaging of the head and maxillofacial structures were performed using the standard protocol without intravenous contrast. Multiplanar CT image reconstructions of the maxillofacial structures were also generated.  COMPARISON:  None.  FINDINGS: CT HEAD FINDINGS  Brain: Ventricles are normal size and configuration. There is some mild foci of increased attenuation identified in the left frontal lobe at the gray-white matter junction which given the recent injury may represent a mild shear injury and concussion. No findings to suggest acute infarct are noted.  Vascular: No hyperdense vessel or unexpected calcification.  Skull: Normal. Negative for fracture or focal lesion.  Other: Left frontal scalp hematoma is noted.  CT MAXILLOFACIAL FINDINGS  Osseous: Mildly displaced nasal bone fracture is noted. No other bony abnormality is seen.  Orbits: Orbits and their contents are within normal limits.  Sinuses:  Paranasal sinuses are well aerated without air-fluid levels.  Soft tissues: Left frontal scalp hematoma is noted extending inferiorly to the left  periorbital region. No other significant soft tissue abnormality is noted.  IMPRESSION: CT of the head: Changes consistent with a mild shear injury and some parenchymal hemorrhage in the posterior left frontal lobe. Follow-up imaging is recommended.  CT of the maxillofacial bones: Nasal bone fracture and mild left facial swelling as described.   Electronically Signed   By: Alcide CleverMark  Lukens M.D.   On: 01/21/2018 13:26 I personally (independently) visualized and performed the interpretation of the images attached in this note.     Impression and Recommendations:    Assessment and Plan: 22 y.o. female with  Traumatic brain injury.  Patient has symptoms predominantly in the vestibular system however she does have a significant headache which she notes is mildly worsening.  She does have mild break or hemorrhage on CT scan and follow-up is recommended.  I think at this point is reasonable to proceed with MRI of her brain to follow-up with the injury seen on the original CT scan.  Additionally because she has a pre-existing headache disorder and her headache is worsening I do think getting neurology involved early is probably reasonable.  Referral to neurology ordered. Plan to see with mild cognitive rest and imaging as noted above.  Likely next week will be starting Topamax however like to give her brain a chance to recover a bit.  I anticipate will be doing vestibular physical therapy as well in the near future.   In the meantime she clearly is unable to work and will proceed with work forms and recheck in 1 week.  Return sooner if needed.     Orders Placed This Encounter  Procedures  . MR Brain Wo Contrast    Standing Status:   Future    Standing Expiration Date:   04/08/2019    Order Specific Question:   What is the patient's sedation requirement?    Answer:   No Sedation    Order Specific Question:   Does the patient have a pacemaker or implanted devices?    Answer:   No     Order Specific Question:   [facility]erred [facility]?    [facility]    Order Specific Question:   Radiology Contrast Protocol - do NOT remove file path    Answer:   [email or web address]  . Ambulatory referral to Neurology    Referral Priority:   Routine    Referral Type:   Consultation    Referral Reason:   Specialty Services Required    Requested Specialty:   Neurology    Number of Visits Requested:   1   Meds ordered this encounter  Medications  . meclizine (ANTIVERT) 25 MG tablet    Sig: Take 1 tablet (25 mg total) by mouth 3 (three) times daily as needed for dizziness or nausea.    Dispense:  90 tablet    Refill:  3    Discussed warning signs or symptoms. Please see discharge instructions. Patient expresses understanding.

## 2018-02-08 ENCOUNTER — Telehealth: Payer: Self-pay

## 2018-02-08 NOTE — Telephone Encounter (Signed)
Pt stated that her headaches are getting worse. She said that you were gone send in topomax to pharmacy but when she got there it was not available. Would like you to send in if possible. WJC.Balian Schaller,CCMA

## 2018-02-11 NOTE — Telephone Encounter (Signed)
No additional encounter notes. WJC, CCMA

## 2018-02-13 ENCOUNTER — Ambulatory Visit: Payer: Self-pay | Admitting: Family Medicine

## 2018-02-18 ENCOUNTER — Encounter: Payer: Self-pay | Admitting: Neurology

## 2018-04-01 NOTE — Progress Notes (Deleted)
NEUROLOGY CONSULTATION NOTE  Christina Wilson MRN: [id number] DOB: July 05, 1996  Referring provider: Gennie Alma, MD Primary care provider: Carlis Stable, PA-C  Reason for consult:  Post-traumatic headache  HISTORY OF PRESENT ILLNESS: Christina Wilson is a 22 year old ***-handed female with depression, anxiety, and dysautonomia who presents for headache.  History supplemented by ED and PCP notes.  Onset:  On 01/21/18, she fell at work and struck the left side of her face and head against the cash register.  She presented to the ED where CT of head and maxillofacial was personally reviewed and demonstrated mild shear injury and some parenchymal hemorrhage in the posterior frontal lobe as well as nasal bone fracture and mild facial swelling.  She was referred to Dr. Gennie Alma of Sports Medicine for concussion.  MRI of brain is pending.  Location:  *** Quality:  *** Intensity:  ***.  *** denies new headache, thunderclap headache or severe headache that wakes *** from sleep. Aura:  *** Prodrome:  *** Postdrome:  *** Associated symptoms:  ***.  *** denies associated unilateral numbness or weakness. Duration:  *** Frequency:  *** Frequency of abortive medication: *** Triggers:  *** Exacerbating factors:  *** Relieving factors:  *** Activity:  ***  Current NSAIDS:  *** Current analgesics:  *** Current triptans:  *** Current ergotamine:  *** Current anti-emetic:  *** Current muscle relaxants:  *** Current anti-anxiolytic:  Clonazepam 0.5mg  bedtime, 1 mg during day Current sleep aide:  Clonazepam 0.5mg  Current Antihypertensive medications:  Toprol XL 100mg  Current Antidepressant medications:  Sertraline 50mg  Current Anticonvulsant medications:  Divalproex 125mg  three times daily Current anti-CGRP:  no Current Vitamins/Herbal/Supplements:  no Current Antihistamines/Decongestants:  no Other therapy:  no Other medication:  Meclizine 25mg   Past NSAIDS:   *** Past analgesics:  *** Past abortive triptans:  *** Past abortive ergotamine:  *** Past muscle relaxants:  *** Past anti-emetic:  *** Past antihypertensive medications:  *** Past antidepressant medications:  *** Past anticonvulsant medications:  *** Past anti-CGRP:  *** Past vitamins/Herbal/Supplements:  *** Past antihistamines/decongestants:  *** Other past therapies:  ***  Caffeine:  *** Alcohol:  *** Smoker:  *** Diet:  *** Exercise:  *** Depression:  ***; Anxiety:  *** Other pain:  *** Sleep hygiene:  ***  Personal history of headaches:  *** Family history of headache:  ***  PAST MEDICAL HISTORY: Past Medical History:  Diagnosis Date  . Anxiety   . Closed fracture of nasal bone 01/30/2018  . Depression   . Dysautonomia (HCC)   . Dyshidrotic eczema   . Dysmenorrhea   . Migraine   . MVP (mitral valve prolapse)   . Tachycardia   . 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 WISDOM TOOTH EXTRACTION      MEDICATIONS: Current Outpatient Medications on File Prior to Visit  Medication Sig Dispense Refill  . clonazePAM (KLONOPIN) 0.5 MG tablet Take 0.5 mg by mouth at bedtime. Pt reports also taking 2 tablets during the day as needed for anxiety    . divalproex (DEPAKOTE) 125 MG DR tablet Take 125 mg by mouth 3 (three) times daily.     . hyoscyamine (ANASPAZ) 0.125 MG TBDP disintergrating tablet Place 0.125 mg under the tongue.    . meclizine (ANTIVERT) 25 MG tablet Take 1 tablet (25 mg total) by mouth 3 (three) times daily as needed for dizziness  or nausea. 90 tablet 3  . metoprolol succinate (TOPROL-XL) 100 MG 24 hr tablet Take 100 mg by mouth daily. Take with or immediately following a meal.    . sertraline (ZOLOFT) 50 MG tablet Take 50 mg by mouth daily.     No current facility-administered medications on file prior to visit.      ALLERGIES: No Known Allergies  FAMILY HISTORY: Family History  Problem Relation Age of Onset  . Diabetes Mother   . Mitral valve prolapse Mother   . Stroke Mother   . Atrial fibrillation Mother   . Sleep apnea Mother   . Depression Mother   . Anxiety disorder Mother     SOCIAL HISTORY: Social History   Socioeconomic History  . Marital status: Single    Spouse name: Not on file  . Number of children: Not on file  . Years of education: Not on file  . Highest education level: Not on file  Occupational History  . Not on file  Social Needs  . Financial resource strain: Not on file  . Food insecurity:    Worry: Not on file    Inability: Not on file  . Transportation needs:    Medical: Not on file    Non-medical: Not on file  Tobacco Use  . Smoking status: Former Smoker    Types: Cigarettes    Last attempt to quit: 03/10/2014    Years since quitting: 4.0  . Smokeless tobacco: Never Used  . Tobacco comment: 2 cigs/day  Substance and Sexual Activity  . Alcohol use: Never    Frequency: Never  . Drug use: Yes    Types: Marijuana  . Sexual activity: Yes    Birth control/protection: Condom  Lifestyle  . Physical activity:    Days per week: Not on file    Minutes per session: Not on file  . Stress: Not on file  Relationships  . Social connections:    Talks on phone: Not on file    Gets together: Not on file    Attends religious service: Not on file    Active member of club or organization: Not on file    Attends meetings of clubs or organizations: Not on file    Relationship status: Not on file  . Intimate partner violence:    Fear of current or ex partner: Not on file    Emotionally abused: Not on file    Physically abused: Not on file    Forced sexual activity: Not on file  Other Topics Concern  . Not on file  Social History Narrative  . Not on file    REVIEW OF SYSTEMS: Constitutional: No fevers, chills, or sweats, no generalized fatigue, change in  appetite Eyes: No visual changes, double vision, eye pain Ear, nose and throat: No hearing loss, ear pain, nasal congestion, sore throat Cardiovascular: No chest pain, palpitations Respiratory:  No shortness of breath at rest or with exertion, wheezes GastrointestinaI: No nausea, vomiting, diarrhea, abdominal pain, fecal incontinence Genitourinary:  No dysuria, urinary retention or frequency Musculoskeletal:  No neck pain, back pain Integumentary: No rash, pruritus, skin lesions Neurological: as above Psychiatric: No depression, insomnia, anxiety Endocrine: No palpitations, fatigue, diaphoresis, mood swings, change in appetite, change in weight, increased thirst Hematologic/Lymphatic:  No purpura, petechiae. Allergic/Immunologic: no itchy/runny eyes, nasal congestion, recent allergic reactions, rashes  PHYSICAL EXAM: *** General: No acute distress.  Patient appears ***-groomed.  *** Head:  Normocephalic/atraumatic Eyes:  fundi examined but not visualized Neck:  supple, no paraspinal tenderness, full range of motion Back: No paraspinal tenderness Heart: regular rate and rhythm Lungs: Clear to auscultation bilaterally. Vascular: No carotid bruits. Neurological Exam: Mental status: alert and oriented to person, place, and time, recent and remote memory intact, fund of knowledge intact, attention and concentration intact, speech fluent and not dysarthric, language intact. Cranial nerves: CN I: not tested CN II: pupils equal, round and reactive to light, visual fields intact CN III, IV, VI:  full range of motion, no nystagmus, no ptosis CN V: facial sensation intact CN VII: upper and lower face symmetric CN VIII: hearing intact CN IX, X: gag intact, uvula midline CN XI: sternocleidomastoid and trapezius muscles intact CN XII: tongue midline Bulk & Tone: normal, no fasciculations. Motor:  5/5 throughout *** Sensation:  Pinprick *** temperature *** and vibration sensation intact.   ***. Deep Tendon Reflexes:  2+ throughout, *** toes downgoing.  *** Finger to nose testing:  Without dysmetria.  *** Heel to shin:  Without dysmetria.  *** Gait:  Normal station and stride.  Able to turn and tandem walk. Romberg ***.  IMPRESSION: ***  PLAN: ***  Thank you for allowing me to take part in the care of this patient.  Shon MilletAdam Aleksandr Pellow, DO  CC:  Carlis Stableharley Elizabeth Cummings, PA-C  Gennie Almaory Evans, MD

## 2018-04-02 ENCOUNTER — Ambulatory Visit: Payer: Self-pay | Admitting: Neurology

## 2018-05-06 ENCOUNTER — Ambulatory Visit (INDEPENDENT_AMBULATORY_CARE_PROVIDER_SITE_OTHER): Payer: Federal, State, Local not specified - PPO | Admitting: Physician Assistant

## 2018-05-06 ENCOUNTER — Encounter: Payer: Self-pay | Admitting: Physician Assistant

## 2018-05-06 VITALS — BP 100/63 | HR 73 | Wt 103.0 lb

## 2018-05-06 DIAGNOSIS — F411 Generalized anxiety disorder: Secondary | ICD-10-CM

## 2018-05-06 DIAGNOSIS — S069X0S Unspecified intracranial injury without loss of consciousness, sequela: Secondary | ICD-10-CM | POA: Diagnosis not present

## 2018-05-06 DIAGNOSIS — L301 Dyshidrosis [pompholyx]: Secondary | ICD-10-CM

## 2018-05-06 DIAGNOSIS — S069X9A Unspecified intracranial injury with loss of consciousness of unspecified duration, initial encounter: Secondary | ICD-10-CM | POA: Insufficient documentation

## 2018-05-06 DIAGNOSIS — I059 Rheumatic mitral valve disease, unspecified: Secondary | ICD-10-CM

## 2018-05-06 DIAGNOSIS — S0990XS Unspecified injury of head, sequela: Secondary | ICD-10-CM

## 2018-05-06 DIAGNOSIS — S069XAA Unspecified intracranial injury with loss of consciousness status unknown, initial encounter: Secondary | ICD-10-CM | POA: Insufficient documentation

## 2018-05-06 DIAGNOSIS — F0781 Postconcussional syndrome: Secondary | ICD-10-CM | POA: Insufficient documentation

## 2018-05-06 DIAGNOSIS — K8020 Calculus of gallbladder without cholecystitis without obstruction: Secondary | ICD-10-CM

## 2018-05-06 DIAGNOSIS — R29818 Other symptoms and signs involving the nervous system: Secondary | ICD-10-CM | POA: Insufficient documentation

## 2018-05-06 DIAGNOSIS — R42 Dizziness and giddiness: Secondary | ICD-10-CM

## 2018-05-06 DIAGNOSIS — S134XXA Sprain of ligaments of cervical spine, initial encounter: Secondary | ICD-10-CM | POA: Diagnosis not present

## 2018-05-06 DIAGNOSIS — F329 Major depressive disorder, single episode, unspecified: Secondary | ICD-10-CM | POA: Diagnosis not present

## 2018-05-06 DIAGNOSIS — G43009 Migraine without aura, not intractable, without status migrainosus: Secondary | ICD-10-CM

## 2018-05-06 DIAGNOSIS — F32A Depression, unspecified: Secondary | ICD-10-CM

## 2018-05-06 MED ORDER — HYOSCYAMINE SULFATE 0.125 MG PO TBDP: 0.1250 mg | ORAL_TABLET | ORAL | 2 refills | Status: DC | PRN

## 2018-05-06 MED ORDER — METOPROLOL SUCCINATE ER 100 MG PO TB24: 100.0000 mg | ORAL_TABLET | Freq: Every day | ORAL | 1 refills | Status: DC

## 2018-05-06 MED ORDER — DOXEPIN HCL 10 MG/ML PO CONC: 25.0000 mg | Freq: Every day | ORAL | 5 refills | Status: DC

## 2018-05-06 MED ORDER — CLONAZEPAM 0.5 MG PO TABS: 0.2500 mg | ORAL_TABLET | Freq: Two times a day (BID) | ORAL | 2 refills | Status: DC | PRN

## 2018-05-06 MED ORDER — CLOBETASOL PROPIONATE 0.05 % EX OINT: 1.0000 "application " | TOPICAL_OINTMENT | Freq: Two times a day (BID) | CUTANEOUS | 1 refills | Status: DC

## 2018-05-06 MED ORDER — SERTRALINE HCL 100 MG PO TABS: 100.0000 mg | ORAL_TABLET | Freq: Every day | ORAL | 1 refills | Status: DC

## 2018-05-06 NOTE — Addendum Note (Signed)
Addended by: Gena FrayUMMINGS, CHARLEY E on: 05/06/2018 09:47 AM   Modules accepted: Orders

## 2018-05-06 NOTE — Progress Notes (Signed)
HPI:                                                                Christina Wilson is a 22 y.o. female who presents to 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  She fell at work on 01/21/18 and sustained a nasal bone fracture and shearing injury/parenchymal hemorrhage. Since that time she has struggled with headaches, intermittent dizziness, photosensitivity, and neck stiffness. She has been out of work since 04/08/18.   She recently was evaluated by Dr. Herminio Heads at [facility] for her workman's compensation claim.  She has an order with her today for C-spine w/o contrast, referral to vestibular rehab and referral to neuro-ophthalmology. She was told that she has "a pinched nerve" in her neck. She denies overt neck pain or radicular symptoms.  She is requesting refills of her medications.   Depression screen [facility] 2/9 05/06/2018 01/31/2018  Decreased Interest 2 1  Down, Depressed, Hopeless 1 1  PHQ - 2 Score 3 2  Altered sleeping 1 2  Tired, decreased energy 1 3  Change in appetite 1 3  Feeling bad or failure about yourself  3 0  Trouble concentrating 1 3  Moving slowly or fidgety/restless 1 3  Suicidal thoughts 0 0  PHQ-9 Score 11 16    GAD 7 : Generalized Anxiety Score 05/06/2018 01/31/2018  Nervous, Anxious, on Edge 3 1  Control/stop worrying 3 3  Worry too much - different things 3 3  Trouble relaxing 2 3  Restless 3 3  Easily annoyed or irritable 3 3  Afraid - awful might happen 0 0  Total GAD 7 Score 17 16      Past Medical History:  Diagnosis Date  . Anxiety   . Closed fracture of nasal bone 01/30/2018  . Depression   . Dysautonomia (HCC)   . Dyshidrotic eczema   . Dysmenorrhea   . Migraine   . MVP (mitral valve prolapse)   . Tachycardia   .  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 WISDOM TOOTH EXTRACTION     Social History   Tobacco Use  . Smoking status: Former Smoker    Types: Cigarettes    Last attempt to quit: 03/10/2014    Years since quitting: 4.1  . Smokeless tobacco: Never Used  . Tobacco comment: 2 cigs/day  Substance Use Topics  . Alcohol use: Never    Frequency: Never   family history includes Anxiety disorder in her mother; Atrial fibrillation in her mother; Depression in her mother; Diabetes in her mother; Mitral valve prolapse in her mother; Sleep apnea in her mother; Stroke in her mother.    ROS: negative except as noted in the HPI  Medications: Current Outpatient Medications  Medication Sig Dispense Refill  . Magnesium 100 MG CAPS Take by mouth.    . Omega-3 1000 MG CAPS Take by mouth.    . Vitamin D-Vitamin K (K2 PLUS D3 PO) Take by mouth.    . clonazePAM (KLONOPIN)  0.5 MG tablet Take 0.5-1 tablets (0.25-0.5 mg total) by mouth every 12 (twelve) hours as needed for anxiety (panic attacks). 30 tablet 2  . divalproex (DEPAKOTE) 125 MG DR tablet Take 125 mg by mouth 3 (three) times daily.     Marland Kitchen doxepin (SINEQUAN) 10 MG/ML solution Take 2.5-5 mLs (25-50 mg total) by mouth at bedtime. 120 mL 5  . hyoscyamine (ANASPAZ) 0.125 MG TBDP disintergrating tablet Place 1 tablet (0.125 mg total) under the tongue every 4 (four) hours as needed for cramping. 120 tablet 2  . metoprolol succinate (TOPROL-XL) 100 MG 24 hr tablet Take 1 tablet (100 mg total) by mouth daily. Take with or immediately following a meal. 90 tablet 1  . sertraline (ZOLOFT) 100 MG tablet Take 1 tablet (100 mg total) by mouth daily. 90 tablet 1   No current facility-administered medications for this visit.    No Known Allergies     Objective:  BP 100/63   Pulse 73   Wt 103 lb (46.7 kg)   BMI 18.83 kg/m  Gen:  alert, not  ill-appearing, no distress, appropriate for age HEENT: head normocephalic without obvious abnormality, conjunctiva and cornea clear, trachea midline Pulm: Normal work of breathing, normal phonation, clear to auscultation bilaterally, no wheezes, rales or rhonchi CV: Normal rate, regular rhythm, s1 and s2 distinct, no murmurs, clicks or rubs  Neuro: alert and oriented x 3, no tremor MSK: extremities atraumatic, normal gait and station Skin: intact, no rashes on exposed skin, no jaundice, no cyanosis Psych: well-groomed, cooperative, good eye contact, euthymic mood, affect mood-congruent, speech is articulate, and thought processes clear and goal-directed    No results found for this or any previous visit (from the past 72 hour(s)). No results found.    Assessment and Plan: 22 y.o. female with   .Diagnoses and all orders for this visit:  Depression, unspecified depression type -     sertraline (ZOLOFT) 100 MG tablet; Take 1 tablet (100 mg total) by mouth daily. -     doxepin (SINEQUAN) 10 MG/ML solution; Take 2.5-5 mLs (25-50 mg total) by mouth at bedtime.  Traumatic brain injury, without loss of consciousness, sequela (HCC) -     Ambulatory referral to Neurology -     MR Cervical Spine Wo Contrast; Future -     Ambulatory referral to Physical Therapy  Migraine without aura and without status migrainosus, not intractable -     Ambulatory referral to Neurology  Neck pain with neurological deficit after whiplash injury to neck -     Ambulatory referral to Neurology -     MR Cervical Spine Wo Contrast; Future -     Ambulatory referral to Physical Therapy -     DG Cervical Spine Complete  Generalized anxiety disorder -     sertraline (ZOLOFT) 100 MG tablet; Take 1 tablet (100 mg total) by mouth daily. -     clonazePAM (KLONOPIN) 0.5 MG tablet; Take 0.5-1 tablets (0.25-0.5 mg total) by mouth every 12 (twelve) hours as needed for anxiety (panic attacks).  Mitral valve disorder -      metoprolol succinate (TOPROL-XL) 100 MG 24 hr tablet; Take 1 tablet (100 mg total) by mouth daily. Take with or immediately following a meal.  Gallbladder colic -     hyoscyamine (ANASPAZ) 0.125 MG TBDP disintergrating tablet; Place 1 tablet (0.125 mg total) under the tongue every 4 (four) hours as needed for cramping.  Dizziness due to old head trauma -  Ambulatory referral to Neurology -     Ambulatory referral to Physical Therapy  Postconcussion syndrome   GAD7=17 PHQ9=11, no acute safety issues Increasing Sertraline from 50 to 100 mg daily She was started on Doxepin QHS for neuropathic pain I would like her to avoid using her Clonazepam for sleep and reserve for breakthrough anxiety/panic   Referrals placed to Neurology for management of her headaches and photosensitivity and PT for neck stiffness and vestibular rehab  Instructed to schedule a routine eye exam with [facility]. Of choice  Refills provided as above  Patient education and anticipatory guidance given Patient agrees with treatment plan Follow-up in 2 months for depression/anxiety or sooner as needed if symptoms worsen or fail to improve  Levonne Hubertharley E. Cummings PA-C

## 2018-05-20 ENCOUNTER — Ambulatory Visit (INDEPENDENT_AMBULATORY_CARE_PROVIDER_SITE_OTHER): Payer: Federal, State, Local not specified - PPO

## 2018-05-20 DIAGNOSIS — S069X0D Unspecified intracranial injury without loss of consciousness, subsequent encounter: Secondary | ICD-10-CM | POA: Diagnosis not present

## 2018-05-20 DIAGNOSIS — S134XXA Sprain of ligaments of cervical spine, initial encounter: Secondary | ICD-10-CM

## 2018-05-20 DIAGNOSIS — M436 Torticollis: Secondary | ICD-10-CM | POA: Diagnosis not present

## 2018-05-20 DIAGNOSIS — M5412 Radiculopathy, cervical region: Secondary | ICD-10-CM

## 2018-05-20 DIAGNOSIS — R29818 Other symptoms and signs involving the nervous system: Secondary | ICD-10-CM

## 2018-05-20 DIAGNOSIS — S069X0S Unspecified intracranial injury without loss of consciousness, sequela: Secondary | ICD-10-CM

## 2018-05-20 DIAGNOSIS — W010XXA Fall on same level from slipping, tripping and stumbling without subsequent striking against object, initial encounter: Secondary | ICD-10-CM

## 2018-05-24 ENCOUNTER — Ambulatory Visit (INDEPENDENT_AMBULATORY_CARE_PROVIDER_SITE_OTHER): Payer: Federal, State, Local not specified - PPO | Admitting: Physical Therapy

## 2018-05-24 ENCOUNTER — Encounter: Payer: Self-pay | Admitting: Physical Therapy

## 2018-05-24 DIAGNOSIS — H8113 Benign paroxysmal vertigo, bilateral: Secondary | ICD-10-CM | POA: Diagnosis not present

## 2018-05-24 DIAGNOSIS — R2681 Unsteadiness on feet: Secondary | ICD-10-CM

## 2018-05-24 DIAGNOSIS — M542 Cervicalgia: Secondary | ICD-10-CM | POA: Diagnosis not present

## 2018-05-24 DIAGNOSIS — R42 Dizziness and giddiness: Secondary | ICD-10-CM | POA: Diagnosis not present

## 2018-05-24 DIAGNOSIS — R252 Cramp and spasm: Secondary | ICD-10-CM

## 2018-05-24 NOTE — Patient Instructions (Signed)
Access Code: [email or web address]  URL: [email or web address]  Date: 05/24/2018  Prepared by: Moshe Cipro   Exercises  Seated Gaze Stabilization with Head Rotation - 1 sets - 30 seconds - 2x daily - 7x weekly  Seated Gaze Stabilization with Head Nod - 1 sets - 30 Seconds - 2x daily - 7x weekly  Patient Education  Trigger Point Dry Needling
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
## 2018-05-29 ENCOUNTER — Ambulatory Visit (INDEPENDENT_AMBULATORY_CARE_PROVIDER_SITE_OTHER): Payer: Federal, State, Local not specified - PPO | Admitting: Physical Therapy

## 2018-05-29 ENCOUNTER — Encounter: Payer: Self-pay | Admitting: Physical Therapy

## 2018-05-29 DIAGNOSIS — H8113 Benign paroxysmal vertigo, bilateral: Secondary | ICD-10-CM

## 2018-05-29 DIAGNOSIS — R42 Dizziness and giddiness: Secondary | ICD-10-CM

## 2018-05-29 DIAGNOSIS — R2681 Unsteadiness on feet: Secondary | ICD-10-CM | POA: Diagnosis not present

## 2018-05-29 DIAGNOSIS — M542 Cervicalgia: Secondary | ICD-10-CM

## 2018-05-29 DIAGNOSIS — R252 Cramp and spasm: Secondary | ICD-10-CM
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
## 2018-05-29 NOTE — Patient Instructions (Addendum)
[employer] Go Access Code: [email or web address]

## 2018-05-31 ENCOUNTER — Encounter: Payer: Self-pay | Admitting: Physical Therapy

## 2018-05-31 ENCOUNTER — Ambulatory Visit (INDEPENDENT_AMBULATORY_CARE_PROVIDER_SITE_OTHER): Payer: Federal, State, Local not specified - PPO | Admitting: Physical Therapy

## 2018-05-31 DIAGNOSIS — R42 Dizziness and giddiness: Secondary | ICD-10-CM | POA: Diagnosis not present

## 2018-05-31 DIAGNOSIS — R2681 Unsteadiness on feet: Secondary | ICD-10-CM

## 2018-05-31 DIAGNOSIS — M542 Cervicalgia: Secondary | ICD-10-CM | POA: Diagnosis not present

## 2018-05-31 DIAGNOSIS — H8113 Benign paroxysmal vertigo, bilateral: Secondary | ICD-10-CM

## 2018-05-31 DIAGNOSIS — R252 Cramp and spasm: Secondary | ICD-10-CM

## 2018-05-31 NOTE — Therapy (Signed)
[facility] [place] [address] [place], [place], [address] Phone: [phone]   Fax:  [phone]  Physical Therapy Treatment  Patient Details  Name: Christina Wilson MRN: [id number] Date of Birth: 04/21/1996 Referring Provider (PT): Carlis Stable, [place]   Encounter Date: 05/31/2018  PT End of Session - 05/31/18 1017    Visit Number  3    Number of Visits  12    Date for PT Re-Evaluation  07/05/18    Authorization Type  [employer] (in litigation with workers comp)    PT Start Time  0930    PT Stop Time  1011    PT Time Calculation (min)  41 min    Activity Tolerance  Patient tolerated treatment well    Behavior During Therapy  [facility] for tasks assessed/performed       Past Medical History:  Diagnosis Date  . Anxiety   . Closed fracture of nasal bone 01/30/2018  . Depression   . Dysautonomia (HCC)   . Dyshidrotic eczema   . Dysmenorrhea   . Migraine   . MVP (mitral valve prolapse)   . Tachycardia   . 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 WISDOM TOOTH EXTRACTION      There were no vitals filed for this visit.  Subjective Assessment - 05/31/18 0930    Subjective  stiffness is a little better, Rt side of neck nearly resolved, but Lt side still a little stiff.  Still having some symptoms of imbalance with being up and moving.      Diagnostic tests  CT: parenchymal hemorrhage in the posterior left frontal lobe    Patient Stated Goals  improve dizziness and balance, try to help with neck pain/headaches    Currently in Pain?  Yes    Pain Score  4     Pain Location  Neck    Pain Orientation  Left    Pain Descriptors / Indicators  Tightness   stiffness   Pain Onset  More than a month ago    Pain Frequency  Constant    Aggravating Factors   movement, screens, bright lights    Pain Relieving  Factors  tylenol                        [facility] Adult PT Treatment/Exercise - 05/31/18 1018      Manual Therapy   Manual therapy comments  pt prone; skilled palpation and monitoring of soft tissue during DN    Soft tissue mobilization  STM to cervical paraspinals and bil UT/LS; instructed in use of tennis balls for suboccipital release at home      Vestibular Treatment/Exercise - 05/31/18 1014      Vestibular Treatment/Exercise   Vestibular Treatment Provided  Gaze    Gaze Exercises  X1 Viewing Horizontal;X1 Viewing Vertical      X1 Viewing Horizontal   Foot Position  feet apart on compliant surface with full field stimulus; and walking    Time  --   30 sec; walking 30'   Reps  2    Comments  mild increase in symptoms up to 4/10 with increased activity      X1 Viewing Vertical   Foot Position  feet apart on compliant surface with full field stimulus; and walking    Time  --   30 sec;  walking 30'   Reps  2    Comments  symptoms not as increased with vertical      Trigger Point Dry Needling - 05/31/18 1016    Consent Given?  Yes    Education Handout Provided  Yes    Muscles Treated Upper Body  Longissimus    Longissimus Response  Twitch response elicited;Palpable increased muscle length   cervical paraspinals and bil multifidi       Balance Exercises - 05/31/18 1016      Balance Exercises: Standing   Standing Eyes Closed  Wide (BOA);Foam/compliant surface;2 reps;10 secs   increased sway and LOB   Gait with Head Turns  Forward;2 reps   horizontal and vertical       PT Education - 05/31/18 1017    Education Details  progressed HEP    Person(s) Educated  Patient    Methods  Explanation;Handout;Demonstration    Comprehension  Verbalized understanding;Returned demonstration       PT Short Term Goals - 05/31/18 1017      PT SHORT TERM GOAL #1   Title  formal balance assessment with LTG to follow    Status  Achieved        PT Long Term Goals -  05/31/18 1017      PT LONG TERM GOAL #1   Title  demonstrate negative positional testing    Status  New    Target Date  07/05/18      PT LONG TERM GOAL #2   Title  report 75% improvement in vestibular symptoms for improved function    Status  New      PT LONG TERM GOAL #3   Title  report decreased frequency of headaches and no > 6/10 pain for improved function    Status  New      PT LONG TERM GOAL #4   Title  demonstrate improved balance by performing feet together with EC on compliant surface x 30 sec without LOB    Status  New    Target Date  07/05/18            Plan - 05/31/18 1019    Clinical Impression Statement  Pt progressing well reporting resolve of vertigo, but deferred testing today.  Continues to have symptoms of imbalance with activity so progressed HEP today.  Manual therapy and DN performed again to address stiffness and pain in neck, with pt reporting improved symtpoms following.  Progressing well with PT and reporting improvement overall in symptoms.     Rehab Potential  Good    PT Frequency  2x / week    PT Duration  6 weeks    PT Treatment/Interventions  ADLs/Self Care Home Management;Canalith Repostioning;Cryotherapy;Electrical Stimulation;Ultrasound;Traction;Moist Heat;Gait training;Functional mobility training;Therapeutic activities;Therapeutic exercise;Balance training;Neuromuscular re-education;Patient/family education;Manual techniques;Dry needling;Taping;Vestibular    PT Next Visit Plan  reassess canals, assess response to DN/manual to suboccipitals/cervical paraspinals/upper trap, formal balance assessment PRN, progress vestibular exercises and activity as able    PT Home Exercise Plan  Access Code: [email or web address]    Consulted and Agree with Plan of Care  Patient       Patient will benefit from skilled therapeutic intervention in order to improve the following deficits and impairments:  Increased fascial restricitons, Increased muscle spasms, Pain, Postural  dysfunction, Decreased balance, Dizziness  Visit Diagnosis: Dizziness and giddiness  BPPV (benign paroxysmal positional vertigo), bilateral  Unsteadiness on feet  Cervicalgia  Cramp and spasm     Problem List Patient Active Problem  List   Diagnosis Date Noted  . TBI (traumatic brain injury) (HCC) 05/06/2018  . Neck pain with neurological deficit after whiplash injury to neck 05/06/2018  . Gallbladder colic 05/06/2018  . Dizziness due to old head trauma 05/06/2018  . Postconcussion syndrome 05/06/2018  . Generalized hyperreflexia 01/31/2018  . Migraine without aura and without status migrainosus, not intractable 01/31/2018  . Depression 01/31/2018  . Dyshidrotic eczema   . Dysmenorrhea   . 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 Closed fracture of nasal bone 01/30/2018  . 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 Generalized anxiety disorder 06/12/2016  . Mitral valve disorder 06/12/2016     Clarita Crane, PT, DPT 05/31/18 10:23 AM    [facility] [address] [place], [place], [address] Phone: [phone]   Fax:  [phone]  Name: Christina Wilson MRN: [id number] Date of Birth: 1996-07-27

## 2018-05-31 NOTE — Patient Instructions (Signed)
Access Code: [email or web address]  URL: [email or web address]  Date: 05/31/2018  Prepared by: Moshe Cipro   Exercises  Supine Chin Tuck - 10 reps - 1 sets - 5 sec hold - 2x daily - 7x weekly  Seated Upper Trapezius Stretch - 3 reps - 1 sets - 30 sec hold - 2x daily - 7x weekly  Seated Levator Scapulae Stretch - 1 reps - 1 sets - 30 sec hold - 2x daily - 7x weekly  Walking Gaze Stabilization Head Rotation - 1 reps - 1 sets - 10-15 feet - 1x daily - 7x weekly  Walking Gaze Stabilization Head Nod - 1 reps - 1 sets - 10-15 feet - 1x daily - 7x weekly  Brandt-Daroff Vestibular Exercise - 3-5 reps - 1 sets - 2x daily - 7x weekly  Standing Gaze Stabilization with Head Rotation - 1 sets - 30 Seconds - 2x daily - 7x weekly  Walking with Head Rotation - 1 reps - 1 sets - 10-15 feet - 1x daily - 7x weekly  Patient Education  Trigger Point Dry Needling

## 2018-06-03 ENCOUNTER — Encounter: Payer: Self-pay | Admitting: Physician Assistant

## 2018-06-03 ENCOUNTER — Ambulatory Visit (INDEPENDENT_AMBULATORY_CARE_PROVIDER_SITE_OTHER): Payer: Federal, State, Local not specified - PPO | Admitting: Physical Therapy

## 2018-06-03 ENCOUNTER — Ambulatory Visit (INDEPENDENT_AMBULATORY_CARE_PROVIDER_SITE_OTHER): Payer: Federal, State, Local not specified - PPO | Admitting: Physician Assistant

## 2018-06-03 ENCOUNTER — Encounter: Payer: Self-pay | Admitting: Physical Therapy

## 2018-06-03 VITALS — BP 110/75 | HR 76 | Wt 107.0 lb

## 2018-06-03 DIAGNOSIS — R42 Dizziness and giddiness: Secondary | ICD-10-CM

## 2018-06-03 DIAGNOSIS — F0781 Postconcussional syndrome: Secondary | ICD-10-CM | POA: Diagnosis not present

## 2018-06-03 DIAGNOSIS — G43009 Migraine without aura, not intractable, without status migrainosus: Secondary | ICD-10-CM

## 2018-06-03 DIAGNOSIS — R2681 Unsteadiness on feet: Secondary | ICD-10-CM | POA: Diagnosis not present

## 2018-06-03 DIAGNOSIS — M542 Cervicalgia: Secondary | ICD-10-CM | POA: Diagnosis not present

## 2018-06-03 DIAGNOSIS — H8113 Benign paroxysmal vertigo, bilateral: Secondary | ICD-10-CM | POA: Diagnosis not present

## 2018-06-03 DIAGNOSIS — R252 Cramp and spasm: Secondary | ICD-10-CM

## 2018-06-03 NOTE — Progress Notes (Signed)
HPI:                                                                Christina Wilson is a 22 y.o. female who presents to [facility]: Primary Care Sports Medicine today for follow-up  This is a pleasant 22 yo F with PMH of GAD, depression, MVP, biliary colic, migraines, history of TBI 01/21/18 and post-concussive syndrome.  She fell at work on 01/21/18 and sustained a nasal bone fracture and shearing injury/parenchymal hemorrhage. Since that time she has struggled with headaches, intermittent dizziness, photosensitivity, and neck stiffness. She has been out of work since 04/08/18.   She was seen by headache specialist on 05/15/18 and diagnosed with post-concussive syndrome and chronic migraine. She is questioning this diagnosis. She is still having daily bilateral headache in parietal area. They are worse with exertion. Headaches are rated 3-7 in severity. Associated with photophobia and phonophobia. She was prescribed Emgality, Baclofen, and her dose of Depakote was increased. She has not started these new medications yet. She wanted to check with me first.   Reports PT has been very helpful. She has completed 3 sessions. She is doing dry needling in her neck. Still having intermittent dizziness which is worse with exertion.  She has not returned to work, but states she is beginning to get bored and would like to return to work soon. Her biggest barrier is she does not feel comfortable driving yet due to dizziness.   Past Medical History:  Diagnosis Date  . Anxiety   . Closed fracture of nasal bone 01/30/2018  . Depression   . Dysautonomia (HCC)   . Dyshidrotic eczema   . Dysmenorrhea   . Migraine   . MVP (mitral valve prolapse)   . Tachycardia   . 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 WISDOM TOOTH EXTRACTION     Social History   Tobacco Use  . Smoking  status: Former Smoker    Types: Cigarettes    Last attempt to quit: 03/10/2014    Years since quitting: 4.2  . Smokeless tobacco: Never Used  . Tobacco comment: 2 cigs/day  Substance Use Topics  . Alcohol use: Never    Frequency: Never   family history includes Anxiety disorder in her mother; Atrial fibrillation in her mother; Depression in her mother; Diabetes in her mother; Mitral valve prolapse in her mother; Sleep apnea in her mother; Stroke in her mother.    ROS: negative except as noted in the HPI  Medications: Current Outpatient Medications  Medication Sig Dispense Refill  . baclofen (LIORESAL) 10 MG tablet Take 10 mg by mouth 3 (three) times daily as needed.    . Galcanezumab-gnlm 120 MG/ML SOAJ Inject into the skin.    . clobetasol ointment (TEMOVATE) 0.05 % Apply 1 application topically 2 (two) times daily. Apply to affected areas cycling 1 week on 1 week off 30 g 1  . clonazePAM (KLONOPIN) 0.5 MG tablet Take 0.5-1 tablets (0.25-0.5 mg total) by mouth every 12 (twelve) hours as needed for anxiety (panic attacks). 30 tablet 2  . divalproex (DEPAKOTE) 125 MG DR tablet Take 125 mg by mouth 3 (three) times  daily.     . doxepin (SINEQUAN) 10 MG/ML solution Take 2.5-5 mLs (25-50 mg total) by mouth at bedtime. 120 mL 5  . hyoscyamine (ANASPAZ) 0.125 MG TBDP disintergrating tablet Place 1 tablet (0.125 mg total) under the tongue every 4 (four) hours as needed for cramping. 120 tablet 2  . Magnesium 100 MG CAPS Take by mouth.    . metoprolol succinate (TOPROL-XL) 100 MG 24 hr tablet Take 1 tablet (100 mg total) by mouth daily. Take with or immediately following a meal. 90 tablet 1  . Omega-3 1000 MG CAPS Take by mouth.    . promethazine (PHENERGAN) 25 MG tablet Take 1 tablet by mouth every 6 (six) hours.    . sertraline (ZOLOFT) 100 MG tablet Take 1 tablet (100 mg total) by mouth daily. 90 tablet 1  . Vitamin D-Vitamin K (K2 PLUS D3 PO) Take by mouth.     No current  facility-administered medications for this visit.    No Known Allergies     Objective:  BP 110/75   Pulse 76   Wt 107 lb (48.5 kg)   LMP 05/27/2018 (Approximate)   BMI 19.57 kg/m  Gen:  alert, not ill-appearing, no distress, appropriate for age HEENT: head normocephalic without obvious abnormality, conjunctiva and cornea clear, trachea midline Pulm: Normal work of breathing, normal phonation Neuro: alert and oriented x 3, no tremor MSK: extremities atraumatic, normal gait and station Skin: intact, no rashes on exposed skin, no jaundice, no cyanosis Psych: well-groomed, cooperative, good eye contact, euthymic mood, affect mood-congruent, speech is articulate, and thought processes clear and goal-directed    No results found for this or any previous visit (from the past 72 hour(s)). No results found.    Assessment and Plan: 22 y.o. female with   .Giavana was seen today for results.  Diagnoses and all orders for this visit:  Postconcussion syndrome  Migraine without aura and without status migrainosus, not intractable  Cervicalgia   - reviewed MR cervical spine results from 05/20/18 with patient. There was mild reversal of cervical lordosis consistent with spasm. There was also borderline-mild facet arthropathy T1-T2. Continue physical therapy and dry needling - given her history of shearing injury 01/21/18 will obtain MR brain for follow-up imaging. This was ordered 02/05/18 but has not been scheduled. I have reprinted the order and it will be scheduled asap - we discussed her diagnosis of post-concussive syndrome. I think it makes sense to treat her for chronic migraine at this point, as there is little downside to doing so. Continue PT/vestibular rehab - she will start Emgality for migraine prophylaxis and Baclofen for abortive therapy - she would like to leave her Depakote dosing the same for now - provided with work note for accommodations. She is going to decide when  she feels comfortable returning to work - headache diary and reassess in 3 months  Patient education and anticipatory guidance given Patient agrees with treatment plan Follow-up in 3 months or sooner as needed if symptoms worsen or fail to improve  Levonne Hubert PA-C

## 2018-06-03 NOTE — Therapy (Signed)
[facility] [place] [address] [place], [place], [address] Phone: [phone]   Fax:  [phone]  Physical Therapy Treatment  Patient Details  Name: Christina Wilson MRN: [id number] Date of Birth: 03-Mar-1996 Referring Provider (PT): Carlis Stable, [place]   Encounter Date: 06/03/2018  PT End of Session - 06/03/18 6962    Visit Number  4    Number of Visits  12    Date for PT Re-Evaluation  07/05/18    Authorization Type  [employer] (in litigation with workers comp)    PT Start Time  [phone]    PT Stop Time  [phone]    PT Time Calculation (min)  41 min    Activity Tolerance  Patient tolerated treatment well    Behavior During Therapy  [facility] for tasks assessed/performed       Past Medical History:  Diagnosis Date  . Anxiety   . Closed fracture of nasal bone 01/30/2018  . Depression   . Dysautonomia (HCC)   . Dyshidrotic eczema   . Dysmenorrhea   . Migraine   . MVP (mitral valve prolapse)   . Tachycardia   . 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 WISDOM TOOTH EXTRACTION      There were no vitals filed for this visit.  Subjective Assessment - 06/03/18 0842    Subjective  went to drag race yesterday and had some difficulty.  but took breaks and symptoms resolved fairly quickly.  symptoms up to 6/10 yesterday.  walked at least a couple miles yesterday.      Diagnostic tests  CT: parenchymal hemorrhage in the posterior left frontal lobe    Patient Stated Goals  improve dizziness and balance, try to help with neck pain/headaches    Currently in Pain?  Yes    Pain Score  5     Pain Location  Head    Pain Orientation  Left    Pain Descriptors / Indicators  Tightness;Headache    Pain Type  Acute pain    Pain Onset  More than a month ago    Pain Frequency  Constant    Aggravating Factors   movement,  screens, bright lights    Pain Relieving Factors  tylenol                       [facility] Adult PT Treatment/Exercise - 06/03/18 0921      Manual Therapy   Manual therapy comments  pt prone and supine; skilled palpation and monitoring of soft tissue during DN    Soft tissue mobilization  STM to cervical paraspinals and bil UT/LS; suboccipital release and STM to bil temporalis muscles       Trigger Point Dry Needling - 06/03/18 0919    Consent Given?  Yes    Education Handout Provided  Yes    Muscles Treated Upper Body  Suboccipitals muscle group   and temporalis bil   Longissimus Response  Twitch response elicited;Palpable increased muscle length             PT Short Term Goals - 05/31/18 1017      PT SHORT TERM GOAL #1   Title  formal balance assessment with LTG to follow    Status  Achieved        PT Long Term Goals - 05/31/18 1017  PT LONG TERM GOAL #1   Title  demonstrate negative positional testing    Status  New    Target Date  07/05/18      PT LONG TERM GOAL #2   Title  report 75% improvement in vestibular symptoms for improved function    Status  New      PT LONG TERM GOAL #3   Title  report decreased frequency of headaches and no > 6/10 pain for improved function    Status  New      PT LONG TERM GOAL #4   Title  demonstrate improved balance by performing feet together with EC on compliant surface x 30 sec without LOB    Status  New    Target Date  07/05/18            Plan - 06/03/18 6045    Clinical Impression Statement  Session today focused on elevated headache with decreased following manual therapy and DN.  Pt with increased activity yesterday resulting in elevated headache, but reports dizziness resolved quickly with rest yesterday.  Progressing well with PT at this time.    Rehab Potential  Good    PT Frequency  2x / week    PT Duration  6 weeks    PT Treatment/Interventions  ADLs/Self Care Home Management;Canalith  Repostioning;Cryotherapy;Electrical Stimulation;Ultrasound;Traction;Moist Heat;Gait training;Functional mobility training;Therapeutic activities;Therapeutic exercise;Balance training;Neuromuscular re-education;Patient/family education;Manual techniques;Dry needling;Taping;Vestibular    PT Next Visit Plan  reassess canals, assess response to DN/manual to suboccipitals/cervical paraspinals/upper trap, formal balance assessment PRN, progress vestibular exercises and activity as able    PT Home Exercise Plan  Access Code: [email or web address]    Consulted and Agree with Plan of Care  Patient;Family member/caregiver    Family Member Consulted  significant other       Patient will benefit from skilled therapeutic intervention in order to improve the following deficits and impairments:  Increased fascial restricitons, Increased muscle spasms, Pain, Postural dysfunction, Decreased balance, Dizziness  Visit Diagnosis: Dizziness and giddiness  BPPV (benign paroxysmal positional vertigo), bilateral  Unsteadiness on feet  Cervicalgia  Cramp and spasm     Problem List Patient Active Problem List   Diagnosis Date Noted  . TBI (traumatic brain injury) (HCC) 05/06/2018  . Neck pain with neurological deficit after whiplash injury to neck 05/06/2018  . Gallbladder colic 05/06/2018  . Dizziness due to old head trauma 05/06/2018  . Postconcussion syndrome 05/06/2018  . Generalized hyperreflexia 01/31/2018  . Migraine without aura and without status migrainosus, not intractable 01/31/2018  . Depression 01/31/2018  . Dyshidrotic eczema   . Dysmenorrhea   . 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 Closed fracture of nasal bone 01/30/2018  . 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 Generalized anxiety disorder 06/12/2016  . Mitral valve disorder 06/12/2016      Clarita Crane, PT, DPT 06/03/18 9:24 AM    [facility] [address] [place], [place], [address] Phone: [phone]   Fax:  [phone]  Name: Christina Wilson MRN: [id number] Date of Birth: [birth year]

## 2018-06-07 ENCOUNTER — Ambulatory Visit (INDEPENDENT_AMBULATORY_CARE_PROVIDER_SITE_OTHER): Payer: Federal, State, Local not specified - PPO | Admitting: Physical Therapy

## 2018-06-07 ENCOUNTER — Encounter: Payer: Self-pay | Admitting: Physical Therapy

## 2018-06-07 DIAGNOSIS — R42 Dizziness and giddiness: Secondary | ICD-10-CM | POA: Diagnosis not present

## 2018-06-07 DIAGNOSIS — R252 Cramp and spasm: Secondary | ICD-10-CM

## 2018-06-07 DIAGNOSIS — M542 Cervicalgia: Secondary | ICD-10-CM

## 2018-06-07 DIAGNOSIS — R2681 Unsteadiness on feet: Secondary | ICD-10-CM | POA: Diagnosis not present

## 2018-06-07 DIAGNOSIS — H8113 Benign paroxysmal vertigo, bilateral: Secondary | ICD-10-CM | POA: Diagnosis not present
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
## 2018-06-10 ENCOUNTER — Encounter: Payer: Self-pay | Admitting: Physical Therapy

## 2018-06-10 ENCOUNTER — Ambulatory Visit (INDEPENDENT_AMBULATORY_CARE_PROVIDER_SITE_OTHER): Payer: Federal, State, Local not specified - PPO | Admitting: Physical Therapy

## 2018-06-10 DIAGNOSIS — H8113 Benign paroxysmal vertigo, bilateral: Secondary | ICD-10-CM | POA: Diagnosis not present

## 2018-06-10 DIAGNOSIS — R2681 Unsteadiness on feet: Secondary | ICD-10-CM | POA: Diagnosis not present

## 2018-06-10 DIAGNOSIS — R252 Cramp and spasm: Secondary | ICD-10-CM | POA: Diagnosis not present

## 2018-06-10 DIAGNOSIS — R42 Dizziness and giddiness: Secondary | ICD-10-CM

## 2018-06-10 DIAGNOSIS — M542 Cervicalgia: Secondary | ICD-10-CM
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
## 2018-06-12 ENCOUNTER — Encounter: Payer: Self-pay | Admitting: Physical Therapy

## 2018-06-12 ENCOUNTER — Ambulatory Visit (INDEPENDENT_AMBULATORY_CARE_PROVIDER_SITE_OTHER): Payer: Federal, State, Local not specified - PPO | Admitting: Physical Therapy

## 2018-06-12 DIAGNOSIS — R2681 Unsteadiness on feet: Secondary | ICD-10-CM

## 2018-06-12 DIAGNOSIS — H8113 Benign paroxysmal vertigo, bilateral: Secondary | ICD-10-CM

## 2018-06-12 DIAGNOSIS — R252 Cramp and spasm: Secondary | ICD-10-CM

## 2018-06-12 DIAGNOSIS — R42 Dizziness and giddiness: Secondary | ICD-10-CM

## 2018-06-12 DIAGNOSIS — M542 Cervicalgia: Secondary | ICD-10-CM

## 2018-06-12 NOTE — Patient Instructions (Signed)
Access Code: [email or web address]  URL: [email or web address]  Date: 06/12/2018  Prepared by: Moshe Cipro   Exercises  Supine Chin Tuck - 10 reps - 1 sets - 5 sec hold - 2x daily - 7x weekly  Seated Upper Trapezius Stretch - 3 reps - 1 sets - 30 sec hold - 2x daily - 7x weekly  Seated Levator Scapulae Stretch - 1 reps - 1 sets - 30 sec hold - 2x daily - 7x weekly  Walking Gaze Stabilization Head Rotation - 1 reps - 1 sets - 10-15 feet - 1x daily - 7x weekly  Walking Gaze Stabilization Head Nod - 1 reps - 1 sets - 10-15 feet - 1x daily - 7x weekly  Brandt-Daroff Vestibular Exercise - 3-5 reps - 1 sets - 2x daily - 7x weekly  Standing Gaze Stabilization with Head Rotation - 1 sets - 30 Seconds - 2x daily - 7x weekly  Walking with Head Rotation - 1 reps - 1 sets - 10-15 feet - 1x daily - 7x weekly  Brandt-Daroff Vestibular Exercise - 3-5 reps - 1 sets - 2x daily - 7x weekly  Patient Education  Trigger Point Dry Needling
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
## 2018-06-13 ENCOUNTER — Encounter: Payer: Self-pay | Admitting: Physician Assistant

## 2018-06-17 ENCOUNTER — Encounter: Payer: Self-pay | Admitting: Physical Therapy

## 2018-06-17 ENCOUNTER — Ambulatory Visit (INDEPENDENT_AMBULATORY_CARE_PROVIDER_SITE_OTHER): Payer: Federal, State, Local not specified - PPO | Admitting: Physical Therapy

## 2018-06-17 DIAGNOSIS — H8113 Benign paroxysmal vertigo, bilateral: Secondary | ICD-10-CM | POA: Diagnosis not present

## 2018-06-17 DIAGNOSIS — M542 Cervicalgia: Secondary | ICD-10-CM

## 2018-06-17 DIAGNOSIS — R252 Cramp and spasm: Secondary | ICD-10-CM | POA: Diagnosis not present

## 2018-06-17 DIAGNOSIS — R42 Dizziness and giddiness: Secondary | ICD-10-CM | POA: Diagnosis not present

## 2018-06-17 DIAGNOSIS — R2681 Unsteadiness on feet: Secondary | ICD-10-CM | POA: Diagnosis not present
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
## 2018-06-19 ENCOUNTER — Encounter: Payer: Self-pay | Admitting: Physical Therapy

## 2018-07-08 ENCOUNTER — Ambulatory Visit: Payer: Self-pay | Admitting: Physician Assistant

## 2018-10-23 ENCOUNTER — Other Ambulatory Visit: Payer: Self-pay

## 2018-10-23 ENCOUNTER — Ambulatory Visit: Payer: Federal, State, Local not specified - PPO | Admitting: Physician Assistant

## 2018-10-23 ENCOUNTER — Encounter: Payer: Self-pay | Admitting: Physician Assistant

## 2018-10-23 VITALS — BP 117/77 | HR 79 | Wt 117.0 lb

## 2018-10-23 DIAGNOSIS — Z79899 Other long term (current) drug therapy: Secondary | ICD-10-CM | POA: Diagnosis not present

## 2018-10-23 DIAGNOSIS — G43009 Migraine without aura, not intractable, without status migrainosus: Secondary | ICD-10-CM

## 2018-10-23 DIAGNOSIS — F39 Unspecified mood [affective] disorder: Secondary | ICD-10-CM

## 2018-10-23 DIAGNOSIS — F411 Generalized anxiety disorder: Secondary | ICD-10-CM

## 2018-10-23 DIAGNOSIS — Z5181 Encounter for therapeutic drug level monitoring: Secondary | ICD-10-CM | POA: Insufficient documentation

## 2018-10-23 MED ORDER — CLONAZEPAM 0.5 MG PO TABS: 0.2500 mg | ORAL_TABLET | Freq: Every day | ORAL | 2 refills | Status: DC | PRN

## 2018-10-23 MED ORDER — SERTRALINE HCL 100 MG PO TABS: 100.0000 mg | ORAL_TABLET | Freq: Every day | ORAL | 1 refills | Status: DC

## 2018-10-23 NOTE — Progress Notes (Signed)
HPI:                                                                Christina Wilson is a 23 y.o. female who presents to [facility]: Primary Care Sports Medicine today for follow-up  This is a pleasant 23 yo F with PMH of GAD, depression, MVP, biliary colic, migraines, history of TBI 01/21/18 and post-concussive syndrome.  She fell at work on 01/21/18 and sustained a nasal bone fracture and shearing injury/parenchymal hemorrhage. Since that time she has struggled with headaches, intermittent dizziness, photosensitivity, and neck stiffness. She has been out of work since 04/08/18.   She was seen by headache specialist on 05/15/18 and diagnosed with post-concussive syndrome and chronic migraine. She is questioning this diagnosis. She is still having daily bilateral headache in parietal area. They are worse with exertion. Headaches are rated 3-7 in severity. Associated with photophobia and phonophobia. She was prescribed Emgality, Baclofen, and her dose of Depakote was increased. She has not started these new medications yet. She wanted to check with me first.   Reports PT has been very helpful. She has completed 3 sessions. She is doing dry needling in her neck. Still having intermittent dizziness which is worse with exertion.  She has not returned to work, but states she is beginning to get bored and would like to return to work soon. Her biggest barrier is she does not feel comfortable driving yet due to dizziness.  10/23/2018 - Patient reports that she feels the best she has ever felt - completed physical therapy for vestibular rehab in December 2019.  Has not had any additional episodes of dizziness - reports mood is better - headaches are "almost non-existent." she never started [employer] She has 2-3 headache days per month. - she is limiting driving at night due to photophobia - she is taking Clonazepam approx once every other day for anxiety -Sleep is restorative,  takes doxepin infrequently as needed -She was able to settle her Workmen's Compensation case.  She is currently applying to jobs and excited about starting a new job  Depression screen [facility] 2/9 10/23/2018 05/06/2018 01/31/2018  Decreased Interest Down, Depressed, Hopeless 0 1 1  PHQ - 2 Score Altered sleeping Tired, decreased energy Change in appetite 0 1 3  Feeling bad or failure about yourself  0 3 0  Trouble concentrating 0 1 3  Moving slowly or fidgety/restless 0 1 3  Suicidal thoughts 0 0 0  PHQ-9 Score GAD 7 : Generalized Anxiety Score 10/23/2018 05/06/2018 01/31/2018  Nervous, Anxious, on Edge 0 3 1  Control/stop worrying Worry too much - different things Trouble relaxing 0 2 3  Restless 0 3 3  Easily annoyed or irritable Afraid - awful might happen 0 0 0  Total GAD 7 Score Past Medical History:  Diagnosis Date  . Anxiety   . Closed fracture of nasal bone 01/30/2018  . Depression   . Dysautonomia ([facility])   . Dyshidrotic eczema   . Dysmenorrhea   . Migraine   .  MVP (mitral valve prolapse)   . Tachycardia   . 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 WISDOM TOOTH EXTRACTION     Social History   Tobacco Use  . Smoking status: Former Smoker    Types: Cigarettes    Last attempt to quit: 03/10/2014    Years since quitting: 4.6  . Smokeless tobacco: Never Used  Substance Use Topics  . Alcohol use: Never    Frequency: Never   family history includes Anxiety disorder in her mother; Atrial fibrillation in her mother; Depression in her mother; Diabetes in her mother; Mitral valve prolapse in her mother; Sleep apnea in her mother; Stroke in her mother.    ROS: negative except as noted in the HPI  Medications: Current Outpatient Medications  Medication Sig Dispense Refill  . clobetasol ointment  (TEMOVATE) 0.05 % Apply 1 application topically 2 (two) times daily. Apply to affected areas cycling 1 week on 1 week off 30 g 1  . clonazePAM (KLONOPIN) 0.5 MG tablet Take 0.5-1 tablets (0.25-0.5 mg total) by mouth daily as needed for anxiety (panic attacks). 20 tablet 2  . divalproex (DEPAKOTE) 125 MG DR tablet Take 125 mg by mouth 2 (two) times daily.    Marland Kitchen doxepin (SINEQUAN) 10 MG/ML solution Take 2.5-5 mLs (25-50 mg total) by mouth at bedtime. 120 mL 5  . hyoscyamine (ANASPAZ) 0.125 MG TBDP disintergrating tablet Place 1 tablet (0.125 mg total) under the tongue every 4 (four) hours as needed for cramping. 120 tablet 2  . Magnesium 100 MG CAPS Take by mouth.    . metoprolol succinate (TOPROL-XL) 100 MG 24 hr tablet Take 1 tablet (100 mg total) by mouth daily. Take with or immediately following a meal. 90 tablet 1  . Omega-3 1000 MG CAPS Take by mouth.    . promethazine (PHENERGAN) 25 MG tablet Take 1 tablet by mouth every 6 (six) hours.    . sertraline (ZOLOFT) 100 MG tablet Take 1 tablet (100 mg total) by mouth daily. 90 tablet 1  . Vitamin D-Vitamin K (K2 PLUS D3 PO) Take by mouth.     No current facility-administered medications for this visit.    No Known Allergies     Objective:  BP 117/77   Pulse 79   Wt 117 lb (53.1 kg)   BMI 21.39 kg/m  Gen:  alert, not ill-appearing, no distress, appropriate for age HEENT: head normocephalic without obvious abnormality, conjunctiva and cornea clear, trachea midline Pulm: Normal work of breathing, normal phonation, clear to auscultation bilaterally CV: Normal rate, regular rhythm, S1 and S2 distinct, no murmurs, no clicks, no rubs Neuro: alert and oriented x 3, no tremor MSK: extremities atraumatic, normal gait and station Skin: intact, no rashes on exposed skin, no jaundice, no cyanosis Psych: well-groomed, cooperative, good eye contact, euthymic mood, affect mood-congruent, speech is articulate, and thought processes clear and  goal-directed    No results found for this or any previous visit (from the past 72 hour(s)). No results found.    Assessment and Plan: 23 y.o. female with   .[place] was seen today for medication management.  Diagnoses and all orders for this visit:  Migraine without aura and without status migrainosus, not intractable  Generalized anxiety disorder -     clonazePAM (KLONOPIN) 0.5 MG tablet; Take 0.5-1 tablets (0.25-0.5 mg total) by mouth daily as needed for anxiety (panic attacks).  Medication monitoring  encounter -     CBC with Differential/Platelet -     COMPLETE METABOLIC PANEL WITH GFR -     Valproic Acid level  On valproate therapy -     CBC with Differential/Platelet -     COMPLETE METABOLIC PANEL WITH GFR -     Valproic Acid level   Migraines Well controlled.  Significantly decreased in frequency and severity after aggressive treatment of her neck pain and postconcussive syndrome.  She never started Emgality and no longer has an indication to do so now that she is only having 2-3 headache days per month.  Mood disorder, generalized anxiety disorder PHQ 9 equals 6 Gad 7 equals 6 Due for routine lab monitoring of her Depakote.  We will refill this medication once labs have resulted Continue sertraline 100 mg daily Checked PDMP, there is no record of her filling clonazepam, which I think represents an error with the PDMP system Refilled clonazepam 0.5 mg.  She understands risk of dependence and addiction and is using the medication appropriately on an as-needed basis.  Patient education and anticipatory guidance given Patient agrees with treatment plan Follow-up in 6 months or sooner as needed if symptoms worsen or fail to improve  Levonne Hubert PA-C

## 2018-10-24 LAB — CBC WITH DIFFERENTIAL/PLATELET
Absolute Monocytes: 763 cells/uL (ref 200–950)
BASOS ABS: 41 {cells}/uL (ref 0–200)
BASOS PCT: 0.5 %
EOS PCT: 0.9 %
Eosinophils Absolute: 74 cells/uL (ref 15–500)
HEMATOCRIT: 42.2 % (ref 35.0–45.0)
HEMOGLOBIN: 14.3 g/dL (ref 11.7–15.5)
LYMPHS ABS: 2444 {cells}/uL (ref 850–3900)
MCH: 31 pg (ref 27.0–33.0)
MCHC: 33.9 g/dL (ref 32.0–36.0)
MCV: 91.3 fL (ref 80.0–100.0)
MPV: 9.9 fL (ref 7.5–12.5)
Monocytes Relative: 9.3 %
NEUTROS ABS: 4879 {cells}/uL (ref 1500–7800)
Neutrophils Relative %: 59.5 %
Platelets: 390 10*3/uL (ref 140–400)
RBC: 4.62 10*6/uL (ref 3.80–5.10)
RDW: 12.6 % (ref 11.0–15.0)
Total Lymphocyte: 29.8 %
WBC: 8.2 10*3/uL (ref 3.8–10.8)

## 2018-10-24 LAB — COMPLETE METABOLIC PANEL WITH GFR
AG Ratio: 1.7 (calc) (ref 1.0–2.5)
ALBUMIN MSPROF: 4.7 g/dL (ref 3.6–5.1)
ALT: 13 U/L (ref 6–29)
AST: 14 U/L (ref 10–30)
Alkaline phosphatase (APISO): 49 U/L (ref 31–125)
BUN: 12 mg/dL (ref 7–25)
CO2: 25 mmol/L (ref 20–32)
CREATININE: 0.59 mg/dL (ref 0.50–1.10)
Calcium: 9.9 mg/dL (ref 8.6–10.2)
Chloride: 103 mmol/L (ref 98–110)
GFR, EST AFRICAN AMERICAN: 149 mL/min/{1.73_m2} (ref >=60)
GFR, Est Non African American: 128 mL/min/{1.73_m2} (ref >=60)
GLOBULIN: 2.7 g/dL (ref 1.9–3.7)
GLUCOSE: 83 mg/dL (ref 65–99)
POTASSIUM: 4.2 mmol/L (ref 3.5–5.3)
SODIUM: 137 mmol/L (ref 135–146)
TOTAL PROTEIN: 7.4 g/dL (ref 6.1–8.1)
Total Bilirubin: 0.5 mg/dL (ref 0.2–1.2)

## 2018-10-24 LAB — VALPROIC ACID LEVEL: Valproic Acid Lvl: <12.5 mg/L — ABNORMAL LOW (ref 50.0–100.0)

## 2018-10-24 MED ORDER — DIVALPROEX SODIUM 125 MG PO DR TAB: 125.0000 mg | DELAYED_RELEASE_TABLET | Freq: Two times a day (BID) | ORAL | 1 refills | Status: DC

## 2018-10-24 NOTE — Addendum Note (Signed)
Addended by: Gena Fray E on: 10/24/2018 02:05 PM   Modules accepted: Orders

## 2018-10-31 ENCOUNTER — Ambulatory Visit (INDEPENDENT_AMBULATORY_CARE_PROVIDER_SITE_OTHER): Payer: Federal, State, Local not specified - PPO | Admitting: Physician Assistant

## 2018-10-31 ENCOUNTER — Other Ambulatory Visit (HOSPITAL_COMMUNITY)
Admission: RE | Admit: 2018-10-31 | Discharge: 2018-10-31 | Disposition: A | Discharge status: Home / Self Care | Payer: Federal, State, Local not specified - PPO | Source: Ambulatory Visit | Attending: Physician Assistant | Admitting: Physician Assistant

## 2018-10-31 ENCOUNTER — Other Ambulatory Visit: Payer: Self-pay

## 2018-10-31 ENCOUNTER — Encounter: Payer: Self-pay | Admitting: Physician Assistant

## 2018-10-31 VITALS — BP 131/91 | HR 101 | Wt 117.0 lb

## 2018-10-31 DIAGNOSIS — Z113 Encounter for screening for infections with a predominantly sexual mode of transmission: Secondary | ICD-10-CM | POA: Diagnosis not present

## 2018-10-31 DIAGNOSIS — N83299 Other ovarian cyst, unspecified side: Secondary | ICD-10-CM

## 2018-10-31 DIAGNOSIS — Z23 Encounter for immunization: Secondary | ICD-10-CM | POA: Diagnosis not present

## 2018-10-31 DIAGNOSIS — Z124 Encounter for screening for malignant neoplasm of cervix: Secondary | ICD-10-CM | POA: Insufficient documentation

## 2018-10-31 DIAGNOSIS — N6320 Unspecified lump in the left breast, unspecified quadrant: Secondary | ICD-10-CM

## 2018-10-31 DIAGNOSIS — Z1239 Encounter for other screening for malignant neoplasm of breast: Secondary | ICD-10-CM

## 2018-10-31 NOTE — Patient Instructions (Signed)
Preventing Cervical Cancer  Cervical cancer is cancer that grows on the cervix. The cervix is at the bottom of the uterus. It connects the uterus to the vagina. The uterus is where a baby develops during pregnancy. Cancer occurs when cells become abnormal and start to grow out of control. Cervical cancer grows slowly and may not cause any symptoms at first. Over time, the cancer can grow deep into the cervix tissue and spread to other areas. If it is found early, cervical cancer can be treated effectively. You can also take steps to prevent this type of cancer. Most cases of cervical cancer are caused by an STI (sexually transmitted infection) called human papillomavirus (HPV). One way to reduce your risk of cervical cancer is to avoid infection with the HPV virus. You can do this by practicing safe sex and by getting the HPV vaccine. Getting regular Pap tests is also important because this can help identify changes in cells that could lead to cancer. Your chances of getting this disease can also be reduced by making certain lifestyle changes. How can I protect myself from cervical cancer? Preventing HPV infection  Ask your health care provider about getting the HPV vaccine. If you are 26 years old or younger, you may need to get this vaccine, which is given in three doses over 6 months. This vaccine protects against the types of HPV that could cause cancer.  Limit the number of people you have sex with. Also avoid having sex with people who have had many sex partners.  Use a latex condom during sex. Getting Pap tests  Get Pap tests regularly, starting at age 21. Talk with your health care provider about how often you need these tests. ? Most women who are 21?25 years of age should have a Pap test every 3 years. ? Most women who are 30?25 years of age should have a Pap test in combination with an HPV test every 5 years. ? Women with a higher risk of cervical cancer, such as those with a weakened  immune system or those who have been exposed to the drug diethylstilbestrol (DES), may need more frequent testing. Making other lifestyle changes  Do not use any products that contain nicotine or tobacco, such as cigarettes and e-cigarettes. If you need help quitting, ask your health care provider.  Eat at least 5 servings of fruits and vegetables every day.  Lose weight if you are overweight. Why are these changes important?  These changes and screening tests are designed to address the factors that are known to increase the risk of cervical cancer. Taking these steps is the best way to reduce your risk.  Having regular Pap tests will help identify changes in cells that could lead to cancer. Steps can then be taken to prevent cancer from developing.  These changes will also help find cervical cancer early. This type of cancer can be treated effectively if it is found early. It can be more dangerous and difficult to treat if cancer has grown deep into your cervix or has spread.  In addition to making you less likely to get cervical cancer, these changes will also provide other health benefits, such as the following: ? Practicing safe sex is important for preventing STIs and unplanned pregnancies. ? Avoiding tobacco can reduce your risk for other cancers and health issues. ? Eating a healthy diet and maintaining a healthy weight are good for your overall health. What can happen if changes are not made? In   the early stages, cervical cancer might not have any symptoms. It can take many years for the cancer to grow and get deep into the cervix tissue. This may be happening without you knowing about it. If you develop any symptoms, such as pelvic pain or unusual discharge or bleeding from your vagina, you should see your health care provider right away. If cervical cancer is not found early, you might need treatments such as radiation, chemotherapy, or surgery. In some cases, surgery may mean that  you will not be able to get pregnant or carry a pregnancy to term. Where to find support Talk with your health care provider, school nurse, or local health department for guidance about screening and vaccination. Some children and teens may be able to get the HPV vaccine free of charge through the U.S. government's Vaccines for Children (VFC) program. Other places that provide vaccinations include:  Public health clinics. Check with your local health department.  Federally Qualified Health Centers, where you would pay only what you can afford. To find one near you, check this website: www.fqhc.org/find-an-fqhc/  Rural Health Clinics. These are part of a program for Medicare and Medicaid patients who live in rural areas. The National Breast and Cervical Cancer Early Detection Program also provides breast and cervical cancer screenings and diagnostic services to low-income, uninsured, and underinsured women. Cervical cancer can be passed down through families. Talk with your health care provider or genetic counselor to learn more about genetic testing for cancer. Where to find more information Learn more about cervical cancer from:  American College of Gynecology: www.acog.org/Patients/FAQs/Cervical-Cancer  American Cancer Society: www.cancer.org/cancer/cervicalcancer/  U.S. Centers for Disease Control and Prevention: www.cdc.gov/cancer/cervical/ Summary  Talk with your health care provider about getting the HPV vaccine.  Be sure to get regular Pap tests as recommended by your health care provider.  See your health care provider right away if you have any pelvic pain or unusual discharge or bleeding from your vagina. This information is not intended to replace advice given to you by your health care provider. Make sure you discuss any questions you have with your health care provider. Document Released: 08/15/2015 Document Revised: 03/28/2016 Document Reviewed: 03/28/2016 Elsevier  Interactive Patient Education  2019 [employer]  

## 2018-10-31 NOTE — Progress Notes (Signed)
HPI:                                                                Christina Wilson is a 23 y.o. female who presents to [facility]: Primary Care Sports Medicine today for Pap smear only  Current Concerns include:  She is concerned about recurrent ovarian cysts and dysmenorrhea. Reports she has tried multiple OCP and Nuvaring, but had adverse effects with all of these options. She is interested in other treatment options.  GYN/Sexual Health  Obstetrics: G0P0  Menstrual status: having periods  LMP: 10/13/2018  Menses: regular, dysmenorrhea  Last pap smear: unknown  History of abnormal pap smears: no  Sexually active: yes, 1 female partner  Current contraception: condom  History of STI: never  Health Maintenance Health Maintenance  Topic Date Due  . HIV Screening  03/10/2009  . PAP-Cervical Cytology Screening  03/11/2015  . INFLUENZA VACCINE  03/15/2019 (Originally 03/14/2018)  . PAP SMEAR-Modifier  02/12/2020  . TETANUS/TDAP  01/22/2028    Past Medical History:  Diagnosis Date  . Anxiety   . Closed fracture of nasal bone 01/30/2018  . Depression   . Dysautonomia (HCC)   . Dyshidrotic eczema   . Dysmenorrhea   . Migraine   . MVP (mitral valve prolapse)   . Tachycardia   . 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 WISDOM TOOTH EXTRACTION     Social History   Tobacco Use  . Smoking status: Former Smoker    Types: Cigarettes    Last attempt to quit: 03/10/2014    Years since quitting: 4.6  . Smokeless tobacco: Never Used  Substance Use Topics  . Alcohol use: Never    Frequency: Never   family history includes Anxiety disorder in her mother; Atrial fibrillation in her mother; Depression in her mother; Diabetes in her mother; Mitral valve prolapse in her mother; Sleep apnea in her mother; Stroke in her mother.  ROS: negative except as  noted in the HPI  Medications: Current Outpatient Medications  Medication Sig Dispense Refill  . clobetasol ointment (TEMOVATE) 0.05 % Apply 1 application topically 2 (two) times daily. Apply to affected areas cycling 1 week on 1 week off 30 g 1  . clonazePAM (KLONOPIN) 0.5 MG tablet Take 0.5-1 tablets (0.25-0.5 mg total) by mouth daily as needed for anxiety (panic attacks). 20 tablet 2  . divalproex (DEPAKOTE) 125 MG DR tablet Take 1 tablet (125 mg total) by mouth 2 (two) times daily. 180 tablet 1  . doxepin (SINEQUAN) 10 MG/ML solution Take 2.5-5 mLs (25-50 mg total) by mouth at bedtime. 120 mL 5  . hyoscyamine (ANASPAZ) 0.125 MG TBDP disintergrating tablet Place 1 tablet (0.125 mg total) under the tongue every 4 (four) hours as needed for cramping. 120 tablet 2  . Magnesium 100 MG CAPS Take by mouth.    . metoprolol succinate (TOPROL-XL) 100 MG 24 hr tablet Take 1 tablet (100 mg total) by mouth daily. Take with or immediately following a meal. 90 tablet 1  . Omega-3 1000 MG CAPS Take by mouth.    . promethazine (PHENERGAN) 25 MG tablet Take 1 tablet by mouth every 6 (  six) hours.    . sertraline (ZOLOFT) 100 MG tablet Take 1 tablet (100 mg total) by mouth daily. 90 tablet 1  . Vitamin D-Vitamin K (K2 PLUS D3 PO) Take by mouth.     No current facility-administered medications for this visit.    No Known Allergies     Objective:  BP (!) 131/91   Pulse (!) 101   Wt 117 lb (53.1 kg)   BMI 21.39 kg/m  General Appearance:  Alert, cooperative, no distress, appropriate for age                                  Chest/Breast:  No tenderness, nipple abnormality. No dimpling or discharge; left upper outer quadrant there is a small approx 53mm or less cystic-like mass                          GU: vulva without rashes or lesions, normal introitus and urethral meatus, vaginal mucosa without erythema, normal discharge, cervix slightly friable without lesions  A chaperone was present for the GU  portion of the exam, Olivia Mackie, RMA.    No results found for this or any previous visit (from the past 72 hour(s)). No results found.    Assessment and Plan: 23 y.o. female with   .Hanin was seen today for annual exam.  Diagnoses and all orders for this visit:  Encounter for Papanicolaou smear of cervix -     Cytology - PAP  Routine screening for STI (sexually transmitted infection) -     Cytology - PAP -     Hepatitis C antibody -     HIV Antibody (routine testing w rflx) -     RPR  Physiological ovarian cysts -     Ambulatory referral to Obstetrics / Gynecology  Breast mass, left -     MM DIAG BREAST TOMO UNI LEFT; Future -     US BREAST LTD UNI LEFT INC AXILLA; Future  Need for HPV vaccine -     HPV 9-valent vaccine,Recombinat  Encounter for screening breast examination     Patient education and anticipatory guidance given Patient agrees with treatment plan Follow-up based on Pap results or sooner as needed  Levonne Hubert PA-C

## 2018-11-01 LAB — HEPATITIS C ANTIBODY
HEP C AB: NONREACTIVE
SIGNAL TO CUT-OFF: 0.01 (ref <1.00)

## 2018-11-01 LAB — HIV ANTIBODY (ROUTINE TESTING W REFLEX): HIV: NONREACTIVE

## 2018-11-01 LAB — RPR: RPR: NONREACTIVE

## 2018-11-04 LAB — CYTOLOGY - PAP
CHLAMYDIA, DNA PROBE: NEGATIVE
Diagnosis: NEGATIVE
Neisseria Gonorrhea: NEGATIVE
TRICH (WINDOWPATH): NEGATIVE

## 2018-11-05 ENCOUNTER — Other Ambulatory Visit: Payer: Self-pay

## 2018-11-05 ENCOUNTER — Ambulatory Visit
Admission: RE | Admit: 2018-11-05 | Discharge: 2018-11-05 | Disposition: A | Discharge status: Home / Self Care | Payer: Federal, State, Local not specified - PPO | Source: Ambulatory Visit | Attending: Physician Assistant | Admitting: Physician Assistant

## 2018-11-05 DIAGNOSIS — N6320 Unspecified lump in the left breast, unspecified quadrant: Secondary | ICD-10-CM

## 2018-12-31 ENCOUNTER — Ambulatory Visit: Payer: Federal, State, Local not specified - PPO

## 2019-01-14 ENCOUNTER — Ambulatory Visit (INDEPENDENT_AMBULATORY_CARE_PROVIDER_SITE_OTHER): Payer: Federal, State, Local not specified - PPO | Admitting: Physician Assistant

## 2019-01-14 VITALS — Temp 98.4°F

## 2019-01-14 DIAGNOSIS — Z23 Encounter for immunization: Secondary | ICD-10-CM | POA: Diagnosis not present

## 2019-01-14 NOTE — Progress Notes (Signed)
Pt presents to office for second Gardasil. It has been 2 months from first injection and patient did not report any side effects or complication.   Patient tolerated injection well today without immediate complications in left deltoid. Patient advised to schedule third injection for 4 months from today.

## 2019-01-27 ENCOUNTER — Encounter: Payer: Federal, State, Local not specified - PPO | Admitting: Obstetrics & Gynecology

## 2019-01-27 DIAGNOSIS — N83209 Unspecified ovarian cyst, unspecified side: Secondary | ICD-10-CM

## 2019-02-11 ENCOUNTER — Other Ambulatory Visit: Payer: Self-pay | Admitting: Physician Assistant

## 2019-02-11 DIAGNOSIS — I059 Rheumatic mitral valve disease, unspecified: Secondary | ICD-10-CM

## 2019-05-19 ENCOUNTER — Ambulatory Visit: Payer: Federal, State, Local not specified - PPO

## 2019-08-28 ENCOUNTER — Emergency Department (INDEPENDENT_AMBULATORY_CARE_PROVIDER_SITE_OTHER)
Admission: EM | Admit: 2019-08-28 | Discharge: 2019-08-28 | Disposition: A | Discharge status: Home / Self Care | Payer: Federal, State, Local not specified - PPO | Source: Home / Self Care

## 2019-08-28 ENCOUNTER — Other Ambulatory Visit: Payer: Self-pay

## 2019-08-28 ENCOUNTER — Encounter: Payer: Self-pay | Admitting: Emergency Medicine

## 2019-08-28 ENCOUNTER — Telehealth: Payer: Self-pay

## 2019-08-28 DIAGNOSIS — K602 Anal fissure, unspecified: Secondary | ICD-10-CM | POA: Diagnosis not present

## 2019-08-28 MED ORDER — HYDROCORTISONE (PERIANAL) 2.5 % EX CREA: 1.0000 "application " | TOPICAL_CREAM | Freq: Two times a day (BID) | CUTANEOUS | 0 refills | Status: DC
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
## 2019-08-28 NOTE — ED Triage Notes (Signed)
Patient c/o rectal bleeding for about 2 weeks, started out as a drop, today it has worsen.

## 2019-08-28 NOTE — Telephone Encounter (Signed)
Pt called stating that she has had had rectal bleeding for the last 2 weeks every time she has gone to the bathroom. Pt states it has been a very minimal amount but she just used the bathroom and there was a lot more blood that usual. Pt states that it was probably about a teaspoon of blood on the toilet paper. Pt states it is BRB. Pt is a former patient of Gena Fray, PA-C. Consulted with Francesco Runner and we agreed that the pt needed to be seen, but due to it being this late in the afternoon, we advised that she be seen through Urgent Care. Recommended that she have a follow up appointment here tomorrow and pt was agreeable to an appt with Christina Butter, DNP, APRN, FNP-BC on 08/29/2019 at 1:00 PM. Pt verbalized understanding of instructions and appointment tomorrow.

## 2019-08-29 ENCOUNTER — Ambulatory Visit: Payer: Federal, State, Local not specified - PPO | Admitting: Medical-Surgical

## 2019-09-03 ENCOUNTER — Ambulatory Visit: Payer: Federal, State, Local not specified - PPO | Admitting: Medical-Surgical

## 2019-10-28 ENCOUNTER — Encounter: Payer: Self-pay | Admitting: Physician Assistant

## 2019-10-28 ENCOUNTER — Other Ambulatory Visit: Payer: Self-pay

## 2019-10-28 ENCOUNTER — Encounter: Payer: Self-pay | Admitting: Medical-Surgical

## 2019-10-28 ENCOUNTER — Ambulatory Visit (INDEPENDENT_AMBULATORY_CARE_PROVIDER_SITE_OTHER): Payer: Federal, State, Local not specified - PPO | Admitting: Medical-Surgical

## 2019-10-28 VITALS — BP 136/81 | HR 86 | Temp 98.0°F | Ht 61.5 in | Wt 128.2 lb

## 2019-10-28 DIAGNOSIS — G43009 Migraine without aura, not intractable, without status migrainosus: Secondary | ICD-10-CM

## 2019-10-28 DIAGNOSIS — R000 Tachycardia, unspecified: Secondary | ICD-10-CM | POA: Diagnosis not present

## 2019-10-28 DIAGNOSIS — F411 Generalized anxiety disorder: Secondary | ICD-10-CM | POA: Diagnosis not present

## 2019-10-28 MED ORDER — CLONAZEPAM 0.5 MG PO TABS: 0.2500 mg | ORAL_TABLET | Freq: Every day | ORAL | 2 refills | Status: DC | PRN

## 2019-10-28 NOTE — Assessment & Plan Note (Signed)
Doing well on Depakote. Checking Valproic acid level today.

## 2019-10-28 NOTE — Progress Notes (Signed)
.  Subjective:    CC: Anxiety follow-up, tachycardia  HPI: Pleasant 24 year old female presenting to follow-up for anxiety and discuss worsening tachycardia symptoms.  General anxiety disorder-taking Zoloft 100 mg daily for several years.  Also uses clonazepam 0.5 mg daily as needed, sometimes taking 1/2 tab but on bad days needing a whole tablet.  Estimates that 20 tablets last her approximately 2 months.  Has never established a counseling relationship but has been giving this a lot of thought lately and is open to the idea.  No SI/HI.  Tachycardia- long history of heart issues since the age of 33-12 years old.  Previously identified mitral valve prolapse with murmur.  Has had frequent episodes of tachycardia through the years but lately has begun to have approximately 2-3 episodes of palpitations with tachycardia lasting 20 to 60 minutes each week, rates approximately 110s-120s per patient's manual count.  Episodes accompanied by shortness of breath and a "spacey" feeling but no diaphoresis, nausea/vomiting, or chest pain.  Episodes occur mostly in the evening when patient is at rest and do not seem to be associated with anxiety. Taking Toprol-XL 100 mg daily.  Has had previous EKGs which were all normal.  Is interested in being connected with a cardiologist for further work-up and management.  I reviewed the past medical history, family history, social history, surgical history, and allergies today and no changes were needed.  Please see the problem list section below in epic for further details.  Past Medical History: Past Medical History:  Diagnosis Date  . Anxiety   . Closed fracture of nasal bone 01/30/2018  . Depression   . Dysautonomia ([place])   . Dyshidrotic eczema   . Dysmenorrhea   . Migraine   . MVP (mitral valve prolapse)   . Tachycardia   . Traumatic cerebral parenchymal hemorrhage ([place]) 01/30/2018   Left frontal lobe (01/21/18)   Past Surgical History: Past Surgical History:   Procedure Laterality Date  . WISDOM TOOTH EXTRACTION     Social History: Social History   Socioeconomic History  . Marital status: Single    Spouse name: Not on file  . Number of children: Not on file  . Years of education: Not on file  . Highest education level: Not on file  Occupational History  . Not on file  Tobacco Use  . Smoking status: Former Smoker    Types: Cigarettes    Quit date: 03/10/2014    Years since quitting: 5.6  . Smokeless tobacco: Never Used  Substance and Sexual Activity  . Alcohol use: Never  . Drug use: Not Currently    Types: Marijuana    Comment: last use 07/2018  . Sexual activity: Yes    Birth control/protection: Condom  Other Topics Concern  . Not on file  Social History Narrative  . Not on file   Social Determinants of Health   Financial Resource Strain:   . Difficulty of Paying Living Expenses:   Food Insecurity:   . Worried About Charity fundraiser in the Last Year:   . Arboriculturist in the Last Year:   Transportation Needs:   . Film/video editor (Medical):   Marland Kitchen Lack of Transportation (Non-Medical):   Physical Activity:   . Days of Exercise per Week:   . Minutes of Exercise per Session:   Stress:   . Feeling of Stress :   Social Connections:   . Frequency of Communication with Friends and Family:   . Frequency  of Social Gatherings with Friends and Family:   . Attends Religious Services:   . Active Member of Clubs or Organizations:   . Attends Banker Meetings:   Marland Kitchen Marital Status:    Family History: Family History  Problem Relation Age of Onset  . Diabetes Mother   . Mitral valve prolapse Mother   . Stroke Mother   . Atrial fibrillation Mother   . Sleep apnea Mother   . Depression Mother   . Anxiety disorder Mother    Allergies: No Known Allergies Medications: See med rec.  Review of Systems: No fevers, chills, night sweats, weight loss, chest pain, or shortness of breath.   Objective:     General: Well Developed, well nourished, and in no acute distress.  Neuro: Alert and oriented x3.  HEENT: Normocephalic, atraumatic.  Skin: Warm and dry. Cardiac: Regular rate and rhythm, no murmurs rubs or gallops, no lower extremity edema.  Respiratory: Clear to auscultation bilaterally. Not using accessory muscles, speaking in full sentences.  EKG-rate 81, normal axis, normal sinus rhythm with sinus arrhythmia  Impression and Recommendations:    Migraine without aura and without status migrainosus, not intractable Doing well on Depakote. Checking Valproic acid level today.  Generalized anxiety disorder Continue Zoloft 100mg  daily and as needed clonazepam. Referring to behavioral health for counseling.  Tachycardia Checking CBC, CMP, lipids, and TSH today.  Continue Toprol XL 100 mg daily.  Given longstanding history of cardiac complications and worsening of tachycardic symptoms, referring to cardiology for further work-up and management.  Return in about 3 months (around 01/28/2020) for anxiety follow up.  ___________________________________________ 01/30/2020, DNP, APRN, FNP-BC Primary Care and Sports Medicine [facility] [place]

## 2019-10-28 NOTE — Assessment & Plan Note (Signed)
Continue Zoloft 100mg  daily and as needed clonazepam. Referring to behavioral health for counseling.

## 2019-10-28 NOTE — Assessment & Plan Note (Signed)
Checking CBC, CMP, lipids, and TSH today.  Continue Toprol XL 100 mg daily.  Given longstanding history of cardiac complications and worsening of tachycardic symptoms, referring to cardiology for further work-up and management.

## 2019-10-29 LAB — CBC
HCT: 43.1 % (ref 35.0–45.0)
Hemoglobin: 14.4 g/dL (ref 11.7–15.5)
MCH: 30.5 pg (ref 27.0–33.0)
MCHC: 33.4 g/dL (ref 32.0–36.0)
MCV: 91.3 fL (ref 80.0–100.0)
MPV: 10 fL (ref 7.5–12.5)
Platelets: 433 10*3/uL — ABNORMAL HIGH (ref 140–400)
RBC: 4.72 10*6/uL (ref 3.80–5.10)
RDW: 12.4 % (ref 11.0–15.0)
WBC: 11 10*3/uL — ABNORMAL HIGH (ref 3.8–10.8)

## 2019-10-29 LAB — COMPLETE METABOLIC PANEL WITH GFR
AG Ratio: 1.9 (calc) (ref 1.0–2.5)
ALT: 18 U/L (ref 6–29)
AST: 15 U/L (ref 10–30)
Albumin: 4.5 g/dL (ref 3.6–5.1)
Alkaline phosphatase (APISO): 60 U/L (ref 31–125)
BUN: 10 mg/dL (ref 7–25)
CO2: 28 mmol/L (ref 20–32)
Calcium: 9.8 mg/dL (ref 8.6–10.2)
Chloride: 101 mmol/L (ref 98–110)
Creat: 0.59 mg/dL (ref 0.50–1.10)
GFR, Est African American: 148 mL/min/{1.73_m2} (ref >=60)
GFR, Est Non African American: 127 mL/min/{1.73_m2} (ref >=60)
Globulin: 2.4 g/dL (calc) (ref 1.9–3.7)
Glucose, Bld: 85 mg/dL (ref 65–99)
Potassium: 4 mmol/L (ref 3.5–5.3)
Sodium: 137 mmol/L (ref 135–146)
Total Bilirubin: 0.4 mg/dL (ref 0.2–1.2)
Total Protein: 6.9 g/dL (ref 6.1–8.1)

## 2019-10-29 LAB — LIPID PANEL
Cholesterol: 168 mg/dL (ref <200)
HDL: 38 mg/dL — ABNORMAL LOW (ref >=50)
LDL Cholesterol (Calc): 83 mg/dL (calc)
Non-HDL Cholesterol (Calc): 130 mg/dL (calc) — ABNORMAL HIGH (ref <130)
Total CHOL/HDL Ratio: 4.4 (calc) (ref <5.0)
Triglycerides: 354 mg/dL — ABNORMAL HIGH (ref <150)

## 2019-10-29 LAB — VALPROIC ACID LEVEL: Valproic Acid Lvl: <12.5 mg/L — ABNORMAL LOW (ref 50.0–100.0)

## 2019-10-29 LAB — TSH: TSH: 1.35 mIU/L

## 2019-11-12 ENCOUNTER — Ambulatory Visit (INDEPENDENT_AMBULATORY_CARE_PROVIDER_SITE_OTHER): Payer: Federal, State, Local not specified - PPO | Admitting: Psychology

## 2019-11-12 DIAGNOSIS — E781 Pure hyperglyceridemia: Secondary | ICD-10-CM | POA: Insufficient documentation

## 2019-11-12 DIAGNOSIS — F411 Generalized anxiety disorder: Secondary | ICD-10-CM | POA: Diagnosis not present

## 2019-11-27 ENCOUNTER — Ambulatory Visit (INDEPENDENT_AMBULATORY_CARE_PROVIDER_SITE_OTHER): Payer: Federal, State, Local not specified - PPO | Admitting: Psychology

## 2019-11-27 DIAGNOSIS — F332 Major depressive disorder, recurrent severe without psychotic features: Secondary | ICD-10-CM | POA: Diagnosis not present

## 2019-11-27 DIAGNOSIS — F411 Generalized anxiety disorder: Secondary | ICD-10-CM | POA: Diagnosis not present

## 2019-12-15 ENCOUNTER — Ambulatory Visit (INDEPENDENT_AMBULATORY_CARE_PROVIDER_SITE_OTHER): Payer: Federal, State, Local not specified - PPO | Admitting: Psychology

## 2019-12-15 DIAGNOSIS — F411 Generalized anxiety disorder: Secondary | ICD-10-CM

## 2019-12-15 DIAGNOSIS — F332 Major depressive disorder, recurrent severe without psychotic features: Secondary | ICD-10-CM | POA: Diagnosis not present

## 2019-12-20 ENCOUNTER — Encounter: Payer: Self-pay | Admitting: Physician Assistant

## 2019-12-22 NOTE — Telephone Encounter (Signed)
Jen,  Can you look into this?

## 2019-12-29 ENCOUNTER — Ambulatory Visit (INDEPENDENT_AMBULATORY_CARE_PROVIDER_SITE_OTHER): Payer: Federal, State, Local not specified - PPO | Admitting: Psychology

## 2019-12-29 DIAGNOSIS — F332 Major depressive disorder, recurrent severe without psychotic features: Secondary | ICD-10-CM | POA: Diagnosis not present

## 2020-01-08 ENCOUNTER — Ambulatory Visit (INDEPENDENT_AMBULATORY_CARE_PROVIDER_SITE_OTHER): Payer: Federal, State, Local not specified - PPO | Admitting: Psychology

## 2020-01-08 DIAGNOSIS — F332 Major depressive disorder, recurrent severe without psychotic features: Secondary | ICD-10-CM | POA: Diagnosis not present

## 2020-01-20 ENCOUNTER — Encounter: Payer: Self-pay | Admitting: Medical-Surgical

## 2020-01-26 ENCOUNTER — Ambulatory Visit (INDEPENDENT_AMBULATORY_CARE_PROVIDER_SITE_OTHER): Payer: Federal, State, Local not specified - PPO | Admitting: Psychology

## 2020-01-26 DIAGNOSIS — F411 Generalized anxiety disorder: Secondary | ICD-10-CM | POA: Diagnosis not present

## 2020-01-26 DIAGNOSIS — F332 Major depressive disorder, recurrent severe without psychotic features: Secondary | ICD-10-CM | POA: Diagnosis not present

## 2020-01-29 ENCOUNTER — Ambulatory Visit: Payer: Federal, State, Local not specified - PPO | Admitting: Medical-Surgical

## 2020-02-09 ENCOUNTER — Ambulatory Visit (INDEPENDENT_AMBULATORY_CARE_PROVIDER_SITE_OTHER): Payer: Federal, State, Local not specified - PPO | Admitting: Psychology

## 2020-02-09 DIAGNOSIS — F322 Major depressive disorder, single episode, severe without psychotic features: Secondary | ICD-10-CM

## 2020-02-09 DIAGNOSIS — F411 Generalized anxiety disorder: Secondary | ICD-10-CM | POA: Diagnosis not present

## 2020-02-13 ENCOUNTER — Other Ambulatory Visit: Payer: Self-pay

## 2020-02-13 ENCOUNTER — Encounter: Payer: Self-pay | Admitting: Medical-Surgical

## 2020-02-13 ENCOUNTER — Ambulatory Visit (INDEPENDENT_AMBULATORY_CARE_PROVIDER_SITE_OTHER): Payer: Federal, State, Local not specified - PPO | Admitting: Medical-Surgical

## 2020-02-13 DIAGNOSIS — G43009 Migraine without aura, not intractable, without status migrainosus: Secondary | ICD-10-CM | POA: Diagnosis not present

## 2020-02-13 DIAGNOSIS — F39 Unspecified mood [affective] disorder: Secondary | ICD-10-CM

## 2020-02-13 DIAGNOSIS — I059 Rheumatic mitral valve disease, unspecified: Secondary | ICD-10-CM | POA: Diagnosis not present

## 2020-02-13 DIAGNOSIS — F411 Generalized anxiety disorder: Secondary | ICD-10-CM | POA: Diagnosis not present

## 2020-02-13 MED ORDER — DIVALPROEX SODIUM 125 MG PO DR TAB: 125.0000 mg | DELAYED_RELEASE_TABLET | Freq: Every day | ORAL | 1 refills | Status: DC

## 2020-02-13 MED ORDER — METOPROLOL SUCCINATE ER 100 MG PO TB24: 100.0000 mg | ORAL_TABLET | Freq: Every day | ORAL | 1 refills | Status: DC

## 2020-02-13 MED ORDER — SERTRALINE HCL 100 MG PO TABS: 100.0000 mg | ORAL_TABLET | Freq: Every day | ORAL | 1 refills | Status: DC

## 2020-02-13 NOTE — Progress Notes (Signed)
Subjective:    CC: Anxiety/depression, migraines, and palpitations follow-up  HPI: Pleasant 24 year old female presenting today for follow-up on anxiety/depression and palpitations.  Anxiety/depression-taking Zoloft 100 mg daily, tolerating well without side effects.  Feels the medication is doing very well for her.  She has also started counseling and has appointments every 2 weeks.  She notes that her counseling has helped tremendously and she is feeling much better overall since starting it.  Denies SI/HI.  Palpitations-taking Toprol-XL 100 mg daily, tolerating well without side effects.  Notes that she does continue to have occasional palpitations that she has been worked up by cardiology who reports there are no new concerns.  She is considering going to see another cardiologist for a second opinion.  Denies chest pain, shortness of breath, dizziness, and headaches.  Migraines-taking Depakote 125 mg DR once daily.  Was previously prescribed twice daily but she notes that once daily dosing is helping to keep her migraines at bay.  Needs a refill on this medication.  I reviewed the past medical history, family history, social history, surgical history, and allergies today and no changes were needed.  Please see the problem list section below in epic for further details.  Past Medical History: Past Medical History:  Diagnosis Date  . Anxiety   . Closed fracture of nasal bone 01/30/2018  . Depression   . Dysautonomia (HCC)   . Dyshidrotic eczema   . Dysmenorrhea   . Migraine   . MVP (mitral valve prolapse)   . Tachycardia   . 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 WISDOM TOOTH EXTRACTION     Social History: Social History   Socioeconomic History  . Marital status: Single    Spouse name: Not on file  . Number of children: Not on file  . Years of  education: Not on file  . Highest education level: Not on file  Occupational History  . Not on file  Tobacco Use  . Smoking status: Former Smoker    Types: Cigarettes    Quit date: 03/10/2014    Years since quitting: 5.9  . Smokeless tobacco: Never Used  Vaping Use  . Vaping Use: Former  Substance and Sexual Activity  . Alcohol use: Never  . Drug use: Not Currently    Types: Marijuana    Comment: last use 07/2018  . Sexual activity: Yes    Birth control/protection: Condom  Other Topics Concern  . Not on file  Social History Narrative  . Not on file   Social Determinants of Health   Financial Resource Strain:   . Difficulty of Paying Living Expenses:   Food Insecurity:   . Worried About Programme researcher, broadcasting/film/video in the Last Year:   . Barista in the Last Year:   Transportation Needs:   . Freight forwarder (Medical):   Marland Kitchen Lack of Transportation (Non-Medical):   Physical Activity:   . Days of Exercise per Week:   . Minutes of Exercise per Session:   Stress:   . Feeling of Stress :   Social Connections:   . Frequency of Communication with Friends and Family:   . Frequency of Social Gatherings with Friends and Family:   . Attends Religious Services:   . Active Member of Clubs or Organizations:   . Attends Banker Meetings:   Marland Kitchen Marital Status:  Family History: Family History  Problem Relation Age of Onset  . Diabetes Mother   . Mitral valve prolapse Mother   . Stroke Mother   . Atrial fibrillation Mother   . Sleep apnea Mother   . Depression Mother   . Anxiety disorder Mother    Allergies: No Known Allergies Medications: See med rec.  Review of Systems: No fevers, chills, night sweats, weight loss, chest pain, or shortness of breath.   Objective:    General: Well Developed, well nourished, and in no acute distress.  Neuro: Alert and oriented x3.  HEENT: Normocephalic, atraumatic.  Skin: Warm and dry. Cardiac: Regular rate and rhythm,  no murmurs rubs or gallops, no lower extremity edema.  Respiratory: Clear to auscultation bilaterally. Not using accessory muscles, speaking in full sentences.   Impression and Recommendations:    1. Migraine without aura and without status migrainosus, not intractable Continue Depakote 125 mg DR once daily.  If migraine symptoms return we need to revisit twice daily dosing. - divalproex (DEPAKOTE) 125 MG DR tablet; Take 1 tablet (125 mg total) by mouth one time daily.  Dispense: 90 tablet; Refill: 1  2. Mood disorder (HCC)/general anxiety disorder Continue sertraline 100 mg daily.  Continue counseling as scheduled. - sertraline (ZOLOFT) 100 MG tablet; Take 1 tablet (100 mg total) by mouth daily.  Dispense: 90 tablet; Refill: 1  3. Mitral valve disorder/palpitations Continue Toprol-XL 100 mg daily.  If she needs referral to a different cardiologist, she will let me know. - metoprolol succinate (TOPROL-XL) 100 MG 24 hr tablet; Take 1 tablet (100 mg total) by mouth daily.  Dispense: 90 tablet; Refill: 0  Return in about 6 months (around 08/15/2020) for mood, migraine, and palpitations follow up. ___________________________________________ Thayer Ohm, DNP, APRN, FNP-BC Primary Care and Sports Medicine [facility] [place]

## 2020-03-03 ENCOUNTER — Ambulatory Visit (INDEPENDENT_AMBULATORY_CARE_PROVIDER_SITE_OTHER): Payer: Federal, State, Local not specified - PPO | Admitting: Psychology

## 2020-03-03 DIAGNOSIS — F332 Major depressive disorder, recurrent severe without psychotic features: Secondary | ICD-10-CM

## 2020-03-18 ENCOUNTER — Ambulatory Visit (INDEPENDENT_AMBULATORY_CARE_PROVIDER_SITE_OTHER): Payer: Federal, State, Local not specified - PPO | Admitting: Psychology

## 2020-03-18 DIAGNOSIS — F411 Generalized anxiety disorder: Secondary | ICD-10-CM

## 2020-03-18 DIAGNOSIS — F332 Major depressive disorder, recurrent severe without psychotic features: Secondary | ICD-10-CM | POA: Diagnosis not present

## 2020-03-31 ENCOUNTER — Ambulatory Visit: Payer: Federal, State, Local not specified - PPO | Admitting: Psychology

## 2020-04-05 ENCOUNTER — Ambulatory Visit (INDEPENDENT_AMBULATORY_CARE_PROVIDER_SITE_OTHER): Payer: Federal, State, Local not specified - PPO | Admitting: Psychology

## 2020-04-05 DIAGNOSIS — F411 Generalized anxiety disorder: Secondary | ICD-10-CM

## 2020-04-05 DIAGNOSIS — F332 Major depressive disorder, recurrent severe without psychotic features: Secondary | ICD-10-CM | POA: Diagnosis not present

## 2020-04-12 IMAGING — MR MR CERVICAL SPINE W/O CM
5 series · 35 of 48 positions shown · non-contrast
Comparison: [HOSPITAL] CT head and face 01/21/2018

CLINICAL DATA: 22-year-old female status post trip and fall injury
in [REDACTED] with suspected cerebral shear injury on CT at that time.
Subsequent neck pain radiating into the head with stiffness and
decreased range of motion.

EXAM:
MRI CERVICAL SPINE WITHOUT CONTRAST
TECHNIQUE: Multiplanar, multisequence MR imaging of the cervical spine was
performed. No intravenous contrast was administered.

[Series 2: T2 · sagittal · 3.0mm · 0.56mm/px · 8 of 13 slices shown (1 of 2)]
[im 1/13]
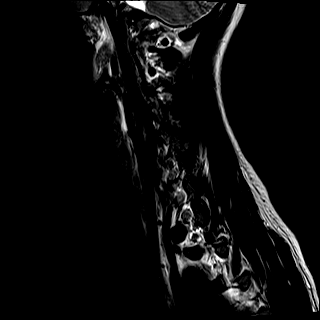
[im 2/13]
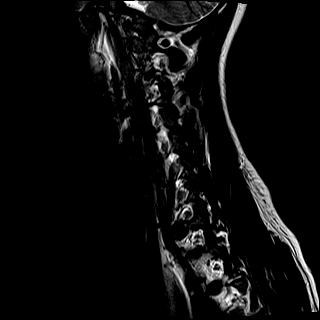
[im 4/13]
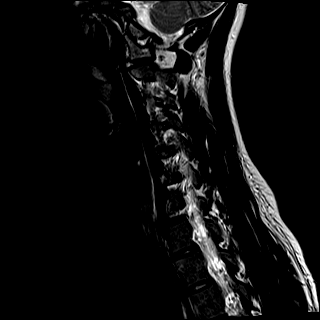
[im 6/13]
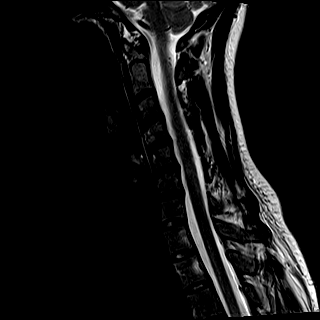
[im 7/13]
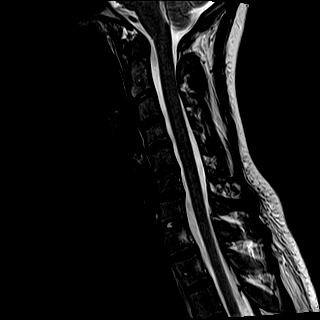
[im 9/13]
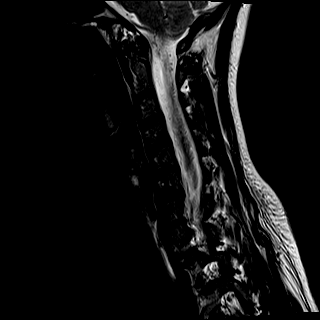
[im 11/13]
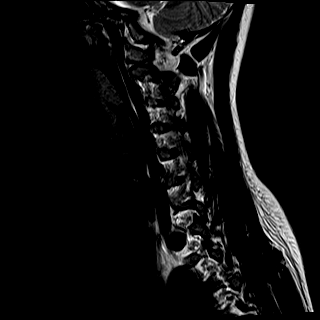
[im 13/13]
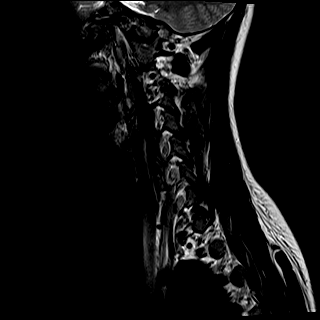

[Series 3: T1 · sagittal · 3.0mm · 0.70mm/px · 7 of 13 slices shown]
[im 1/13]
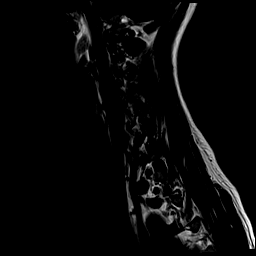
[im 3/13]
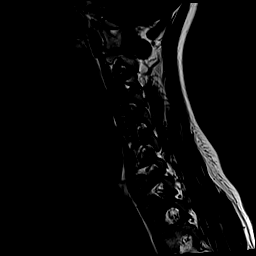
[im 5/13]
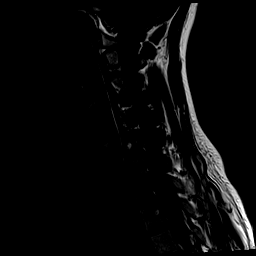
[im 7/13]
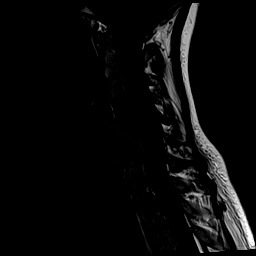
[im 9/13]
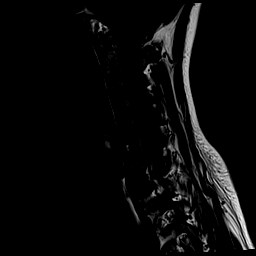
[im 11/13]
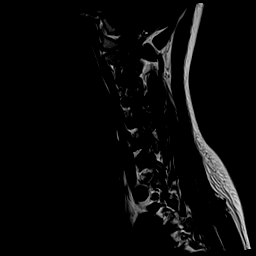
[im 13/13]
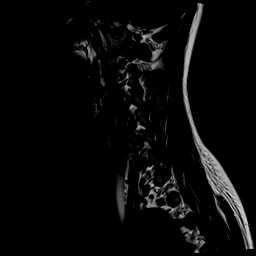

[Series 4: STIR · sagittal · 3.0mm · 0.35mm/px · 7 of 13 slices shown]
[im 1/13]
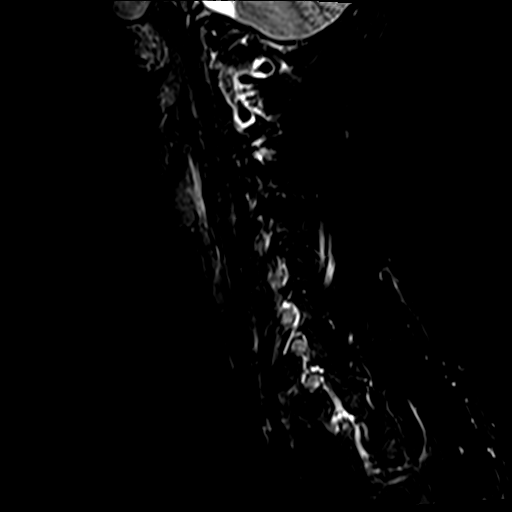
[im 3/13]
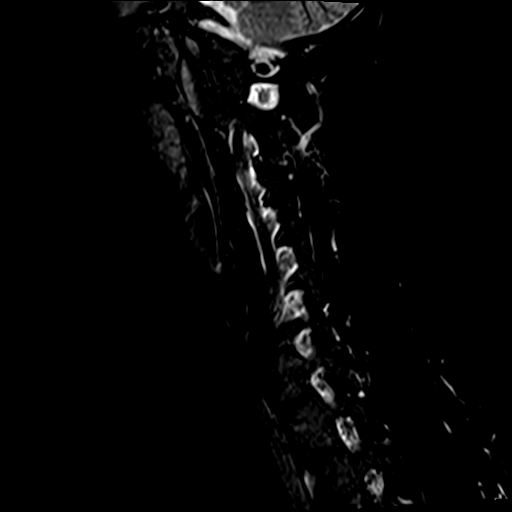
[im 5/13]
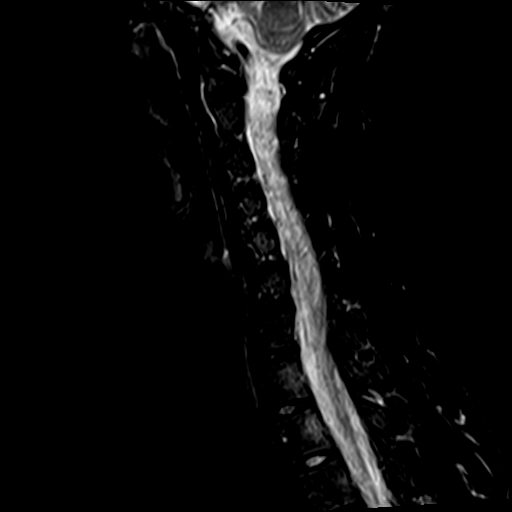
[im 7/13]
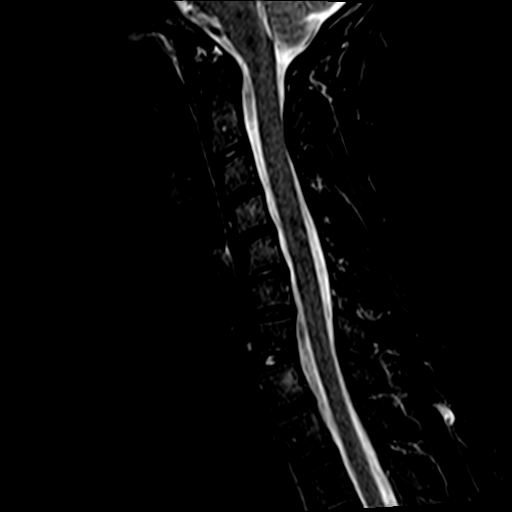
[im 9/13]
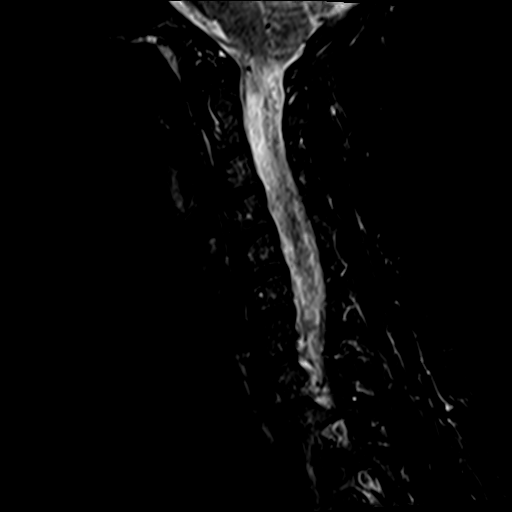
[im 11/13]
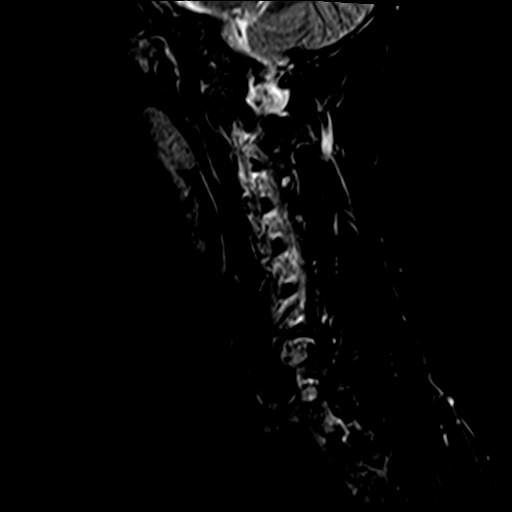
[im 13/13]
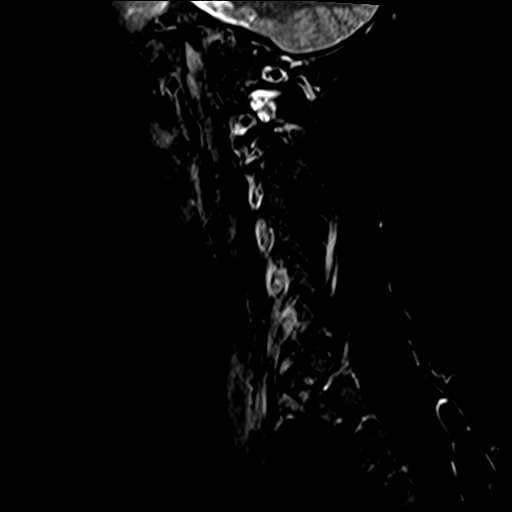

[Series 5: T2 · axial · 3.0mm · 0.62mm/px · z∈[-42,+44]mm · 9 of 24 slices shown (2 of 2)]
[im 1/24]
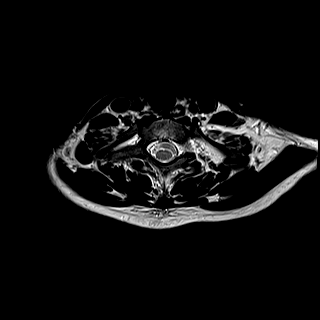
[im 4/24]
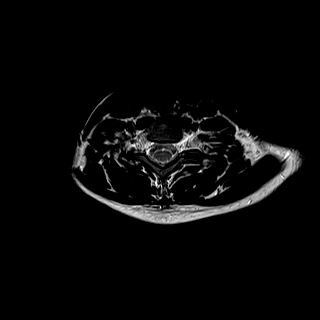
[im 8/24]
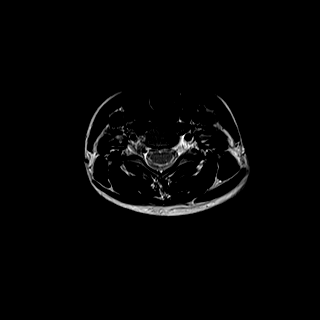
[im 10/24]
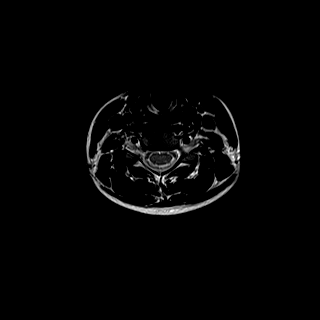
[im 12/24]
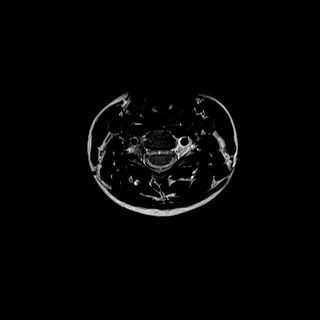
[im 14/24]
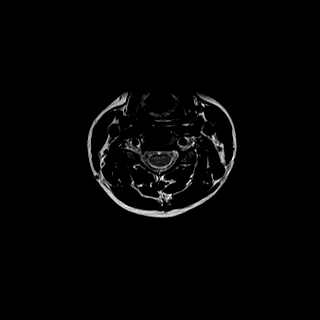
[im 16/24]
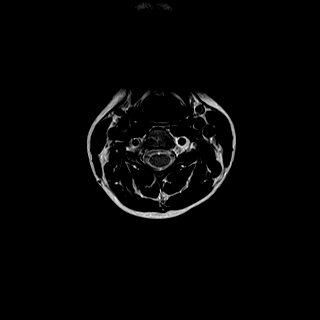
[im 20/24]
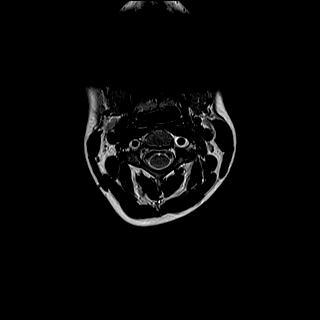
[im 24/24]
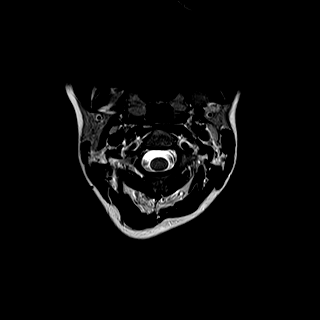

[Series 6: mpgr ax · axial · 3.0mm · 0.35mm/px · z∈[-33,+1]mm · 4 of 24 slices shown]
[im 1/24]
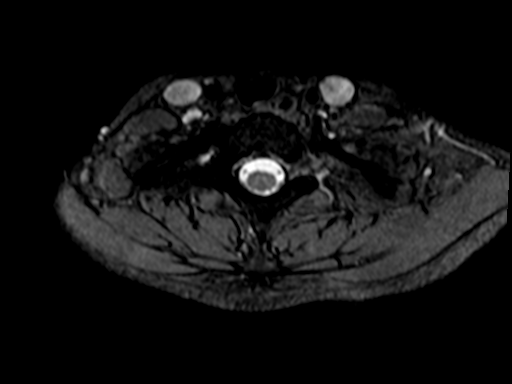
[im 4/24]
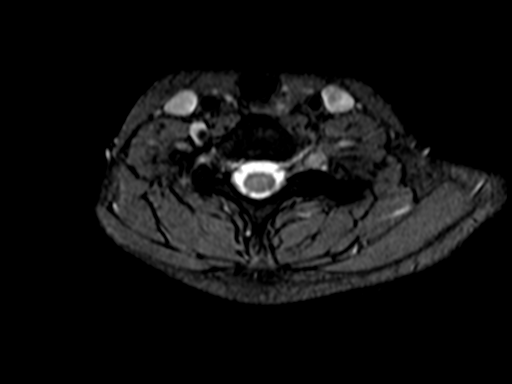
[im 8/24]
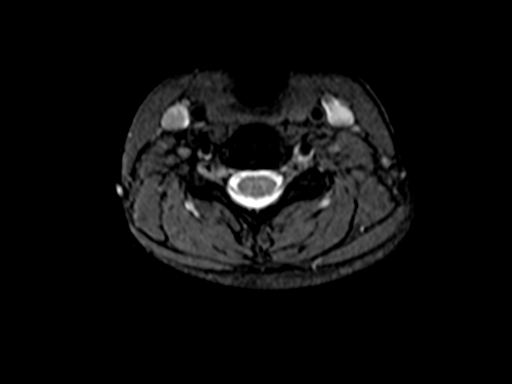
[im 10/24]
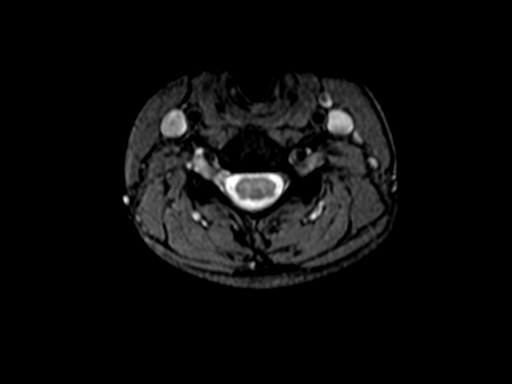

[35 of 48 positions shown; findings below may reference images not displayed]

FINDINGS: Alignment: Mild reversal of cervical lordosis. No spondylolisthesis.

Vertebrae: No marrow edema or evidence of acute osseous abnormality.
Visualized bone marrow signal is within normal limits.

Cord: Spinal cord signal is within normal limits at all visualized
levels.

Posterior Fossa, vertebral arteries, paraspinal tissues:
Cervicomedullary junction is within normal limits. Negative visible
posterior fossa. Preserved major vascular flow voids in the neck.
Negative neck soft tissues. No cervical spine ligamentous complex
signal abnormality.

Disc levels:

C2-C3:  Negative.

C3-C4:  Negative.

C4-C5:  Negative.

C5-C6:  Negative.

C6-C7:  Negative.

C7-T1:  Negative.

Borderline to mild facet hypertrophy at T1-T2. Otherwise negative
visible upper thoracic spine.
IMPRESSION: Normal MRI appearance of the cervical spine aside from nonspecific
reversal of cervical lordosis. No ligamentous or soft tissue
abnormality identified.

## 2020-04-21 ENCOUNTER — Ambulatory Visit (INDEPENDENT_AMBULATORY_CARE_PROVIDER_SITE_OTHER): Payer: Self-pay | Admitting: Psychology

## 2020-04-21 DIAGNOSIS — F332 Major depressive disorder, recurrent severe without psychotic features: Secondary | ICD-10-CM

## 2020-04-21 DIAGNOSIS — F411 Generalized anxiety disorder: Secondary | ICD-10-CM

## 2020-05-05 ENCOUNTER — Ambulatory Visit: Payer: Self-pay | Admitting: Psychology

## 2020-05-17 ENCOUNTER — Ambulatory Visit (INDEPENDENT_AMBULATORY_CARE_PROVIDER_SITE_OTHER): Payer: Self-pay | Admitting: Psychology

## 2020-05-17 DIAGNOSIS — F332 Major depressive disorder, recurrent severe without psychotic features: Secondary | ICD-10-CM

## 2020-05-17 DIAGNOSIS — F411 Generalized anxiety disorder: Secondary | ICD-10-CM

## 2020-05-31 ENCOUNTER — Ambulatory Visit (INDEPENDENT_AMBULATORY_CARE_PROVIDER_SITE_OTHER): Payer: Self-pay | Admitting: Psychology

## 2020-05-31 DIAGNOSIS — F411 Generalized anxiety disorder: Secondary | ICD-10-CM

## 2020-05-31 DIAGNOSIS — F332 Major depressive disorder, recurrent severe without psychotic features: Secondary | ICD-10-CM

## 2020-06-15 ENCOUNTER — Other Ambulatory Visit: Payer: Self-pay

## 2020-06-15 ENCOUNTER — Emergency Department
Admission: RE | Admit: 2020-06-15 | Discharge: 2020-06-15 | Disposition: A | Discharge status: Home / Self Care | Payer: BLUE CROSS/BLUE SHIELD | Source: Ambulatory Visit | Attending: Family Medicine | Admitting: Family Medicine

## 2020-06-15 VITALS — BP 127/86 | HR 96 | Temp 98.1°F | Resp 17 | Ht 62.5 in | Wt 122.0 lb

## 2020-06-15 DIAGNOSIS — B029 Zoster without complications: Secondary | ICD-10-CM

## 2020-06-15 DIAGNOSIS — G43009 Migraine without aura, not intractable, without status migrainosus: Secondary | ICD-10-CM

## 2020-06-15 MED ORDER — DEXAMETHASONE SODIUM PHOSPHATE 10 MG/ML IJ SOLN
10.0000 mg | Freq: Once | INTRAMUSCULAR | Status: AC
  Administered 2020-06-15: 10 mg via INTRAMUSCULAR

## 2020-06-15 MED ORDER — VALACYCLOVIR HCL 1 G PO TABS: 1000.0000 mg | ORAL_TABLET | Freq: Three times a day (TID) | ORAL | 0 refills | Status: DC

## 2020-06-15 MED ORDER — KETOROLAC TROMETHAMINE 60 MG/2ML IM SOLN
60.0000 mg | Freq: Once | INTRAMUSCULAR | Status: AC
  Administered 2020-06-15: 60 mg via INTRAMUSCULAR

## 2020-06-15 NOTE — Discharge Instructions (Addendum)
Rest.  Increase fluid intake.  May take Tylenol as needed for pain.

## 2020-06-15 NOTE — ED Triage Notes (Signed)
Migraine H/A since Sunday morning Pt was at a party on Saturday for Halloween Left jaw pain w/ eye pain - denies vision changes Benadryl for rash to left side of forehead Ibuprofen last night Denies chills, abdominal pain  NO COVID vaccine

## 2020-06-15 NOTE — ED Provider Notes (Signed)
[facility]    CSN: [id number] Arrival date & time: 06/15/20  1751      History   Chief Complaint Chief Complaint  Patient presents with  . Appointment  . Migraine  . Rash    HPI Christina Wilson is a 24 y.o. female.   Patient awoke with a left-sided migraine headache two days ago after being at a Halloween party the night before.  She gradually developed pain in her left jaw and around her left ear.  She states that her present headache is not typical for her usual migraine headaches.  She denies neurologic symptoms.  Last night she developed a rash on her left forehead that has not responded to Benadryl.  The history is provided by the patient. A language interpreter was used.  Migraine This is a recurrent problem. The current episode started 2 days ago. The problem occurs constantly. The problem has not changed since onset.Associated symptoms include headaches. Nothing aggravates the symptoms. Nothing relieves the symptoms. Treatments tried: Ibuprofen. The treatment provided no relief.  Rash Associated symptoms: headaches   Associated symptoms: no fatigue and no fever     Past Medical History:  Diagnosis Date  . Anxiety   . Closed fracture of nasal bone 01/30/2018  . Depression   . Dysautonomia (HCC)   . Dyshidrotic eczema   . Dysmenorrhea   . Migraine   . MVP (mitral valve prolapse)   . Tachycardia   . 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 High triglycerides 11/12/2019  . Tachycardia 10/28/2019  . 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 Breast mass, left 10/31/2018  . On valproate therapy 10/23/2018  . Medication monitoring encounter 10/23/2018  . TBI (traumatic brain injury) (HCC) 05/06/2018  . Neck pain with neurological deficit after whiplash injury to neck 05/06/2018  . Gallbladder colic 05/06/2018  . Dizziness due to old head  trauma 05/06/2018  . Postconcussion syndrome 05/06/2018  . Generalized hyperreflexia 01/31/2018  . Migraine without aura and without status migrainosus, not intractable 01/31/2018  . Mood disorder (HCC) 01/31/2018  . Dyshidrotic eczema   . Dysmenorrhea   . 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 Closed fracture of nasal bone 01/30/2018  . 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 Generalized anxiety disorder 06/12/2016  . Mitral valve disorder 06/12/2016    Past Surgical History:  Procedure Laterality Date  . WISDOM TOOTH EXTRACTION      OB History   No obstetric history on file.      Home Medications    Prior to Admission medications   Medication Sig Start Date End Date Taking? Authorizing Provider  clonazePAM (KLONOPIN) 0.5 MG tablet Take 0.5-1 tablets (0.25-0.5 mg total) by mouth daily as needed for anxiety (panic attacks). 10/28/19  Yes Christen Butter, NP  divalproex (DEPAKOTE) 125 MG DR tablet Take 1 tablet (125 mg total) by mouth daily. 02/13/20  Yes Christen Butter, NP  metoprolol succinate (TOPROL-XL) 100 MG 24 hr tablet Take 1 tablet (100 mg total) by mouth daily. 02/13/20  Yes Christen Butter, NP  sertraline (ZOLOFT) 100 MG tablet Take 1 tablet (100 mg total) by mouth daily. 02/13/20  Yes Christen Butter, NP  clobetasol ointment (TEMOVATE) 0.05 % Apply 1 application topically 2 (two) times daily. Apply to affected areas cycling 1 week on 1 week off 05/06/18   Carlis Stable, [place]  Magnesium 100 MG  CAPS Take by mouth.    [provider]  Omega-3 1000 MG CAPS Take by mouth.    [provider]  valACYclovir (VALTREX) 1000 MG tablet Take 1 tablet (1,000 mg total) by mouth 3 (three) times daily. 06/15/20   Lattie Haw, MD  Vitamin D-Vitamin K (K2 PLUS D3 PO) Take by mouth.    [provider]    Family History Family History  Problem Relation Age of Onset  . Diabetes Mother   . Mitral valve prolapse Mother   . Stroke Mother     . Atrial fibrillation Mother   . Sleep apnea Mother   . Depression Mother   . Anxiety disorder Mother   . Healthy Father     Social History Social History   Tobacco Use  . Smoking status: Former Smoker    Types: Cigarettes    Quit date: 03/10/2014    Years since quitting: 6.2  . Smokeless tobacco: Never Used  Vaping Use  . Vaping Use: Former  Substance Use Topics  . Alcohol use: Never  . Drug use: Not Currently    Types: Marijuana    Comment: last use 07/2018     Allergies   Patient has no known allergies.   Review of Systems Review of Systems  Constitutional: Positive for activity change. Negative for appetite change, chills, diaphoresis, fatigue and fever.  HENT: Negative.   Eyes: Negative.   Respiratory: Negative.   Cardiovascular: Negative.   Gastrointestinal: Negative.   Genitourinary: Negative.   Musculoskeletal: Negative.   Skin: Positive for rash.  Neurological: Positive for headaches. Negative for facial asymmetry, weakness and numbness.     Physical Exam Triage Vital Signs ED Triage Vitals  Enc Vitals Group     BP 06/15/20 0902 127/86     Pulse Rate 06/15/20 0902 96     Resp 06/15/20 0902 17     Temp 06/15/20 0902 98.1 F (36.7 C)     Temp Source 06/15/20 0902 Oral     SpO2 06/15/20 0902 96 %     Weight 06/15/20 0904 122 lb (55.3 kg)     Height 06/15/20 0904 5' 2.5" (1.588 m)     Head Circumference --      Peak Flow --      Pain Score 06/15/20 0904 6     Pain Loc --      Pain Edu? --      Excl. in GC? --    No data found.  Updated Vital Signs BP 127/86 (BP Location: Left Arm)   Pulse 96   Temp 98.1 F (36.7 C) (Oral)   Resp 17   Ht 5' 2.5" (1.588 m)   Wt 55.3 kg   LMP 05/31/2020 (Approximate)   SpO2 96%   BMI 21.96 kg/m   Visual Acuity Right Eye Distance:   Left Eye Distance:   Bilateral Distance:    Right Eye Near:   Left Eye Near:    Bilateral Near:     Physical Exam Vitals and nursing note reviewed.   Constitutional:      General: She is not in acute distress. HENT:     Head: Atraumatic.      Comments: Vesicular herpetiform eruption on left forehead as noted on diagram.     Right Ear: Tympanic membrane, ear canal and external ear normal.     Left Ear: Tympanic membrane, ear canal and external ear normal.     Nose: Nose normal.  Mouth/Throat:     Pharynx: Oropharynx is clear.  Eyes:     Extraocular Movements: Extraocular movements intact.     Conjunctiva/sclera: Conjunctivae normal.     Pupils: Pupils are equal, round, and reactive to light.  Cardiovascular:     Rate and Rhythm: Normal rate and regular rhythm.     Heart sounds: Normal heart sounds.  Pulmonary:     Breath sounds: Normal breath sounds.  Abdominal:     Tenderness: There is no abdominal tenderness.  Musculoskeletal:     Cervical back: Neck supple.     Right lower leg: No edema.     Left lower leg: No edema.  Lymphadenopathy:     Cervical: No cervical adenopathy.  Skin:    General: Skin is warm and dry.     Findings: Rash present.  Neurological:     General: No focal deficit present.     Mental Status: She is alert.     Cranial Nerves: No cranial nerve deficit.      UC Treatments / Results  Labs (all labs ordered are listed, but only abnormal results are displayed) Labs Reviewed - No data to display  EKG   Radiology No results found.  Procedures Procedures (including critical care time)  Medications Ordered in UC Medications  ketorolac (TORADOL) injection 60 mg (has no administration in time range)  dexamethasone (DECADRON) injection 10 mg (has no administration in time range)    Initial Impression / Assessment and Plan / UC Course  I have reviewed the triage vital signs and the nursing notes.  Pertinent labs & imaging results that were available during my care of the patient were reviewed by me and considered in my medical decision making (see chart for details).    Suspect early  viral URI with atypical migraine and herpes zoster. Begin Valtrex. Administered Toradol 60mg  IM and Decadron 10mg  IM Followup with Family Doctor if not improved in about 3 days   Final Clinical Impressions(s) / UC Diagnoses   Final diagnoses:  Migraine without aura and without status migrainosus, not intractable  Herpes zoster without complication     Discharge Instructions     Rest.  Increase fluid intake.  May take Tylenol as needed for pain.    ED Prescriptions    Medication Sig Dispense Auth. Provider   valACYclovir (VALTREX) 1000 MG tablet Take 1 tablet (1,000 mg total) by mouth 3 (three) times daily. 21 tablet , MD        , MD 06/19/20 [phone]

## 2020-06-16 ENCOUNTER — Ambulatory Visit: Payer: Self-pay | Admitting: Psychology

## 2020-06-16 ENCOUNTER — Telehealth: Payer: Self-pay

## 2020-06-16 NOTE — Telephone Encounter (Signed)
Pt called in regards to shingles dx. Says tingling and pain is traveling to eyebrow and lid. Advised to keep taking valacyclovir as prescribed and get rechecked if any changes to vision.

## 2020-06-17 ENCOUNTER — Encounter: Payer: Self-pay | Admitting: Medical-Surgical

## 2020-06-17 ENCOUNTER — Other Ambulatory Visit: Payer: Self-pay

## 2020-06-17 ENCOUNTER — Ambulatory Visit (INDEPENDENT_AMBULATORY_CARE_PROVIDER_SITE_OTHER): Payer: BLUE CROSS/BLUE SHIELD | Admitting: Medical-Surgical

## 2020-06-17 VITALS — BP 112/77 | HR 88 | Temp 98.1°F | Ht 61.5 in | Wt 126.8 lb

## 2020-06-17 DIAGNOSIS — B029 Zoster without complications: Secondary | ICD-10-CM | POA: Diagnosis not present

## 2020-06-17 MED ORDER — KETOROLAC TROMETHAMINE 60 MG/2ML IM SOLN
60.0000 mg | Freq: Once | INTRAMUSCULAR | Status: AC
  Administered 2020-06-17: 60 mg via INTRAMUSCULAR

## 2020-06-17 MED ORDER — ONDANSETRON 8 MG PO TBDP: 8.0000 mg | ORAL_TABLET | Freq: Three times a day (TID) | ORAL | 0 refills | Status: DC | PRN

## 2020-06-17 MED ORDER — GABAPENTIN 100 MG PO CAPS: 100.0000 mg | ORAL_CAPSULE | Freq: Three times a day (TID) | ORAL | 3 refills | Status: DC

## 2020-06-17 MED ORDER — PREDNISONE 10 MG (48) PO TBPK: ORAL_TABLET | Freq: Every day | ORAL | 0 refills | Status: DC

## 2020-06-17 NOTE — Progress Notes (Signed)
Subjective:    CC: urgent care follow-up  HPI: Pleasant 24 year old female presenting today for urgent care follow-up.  On Sunday, she noted a left parietal headache and left eye pressure.  On Monday she developed a rash across her forehead and into her scalp.  On Tuesday she went to urgent care where she was diagnosed with shingles.  They provided her with a prescription for Valtrex 3 times daily x7 days and gave her a shot of Decadron and Toradol.  Wednesday morning she awoke with left jaw pain.  Today she notes that her left jaw pain has worsened and she now has swelling just in front of her ear along her jaw.  She has been having significant levels of nausea as well as a fluctuant low-grade fever.  Taking Valtrex as prescribed but she has not been able to take it this morning due to nausea.  This morning she notes that the rash has begun to involve her eyebrow and she has tingling along the left eyelid.  There are no lesions on her eyelid or around her eye and she has had no vision changes.  She has tried Tylenol for pain but this has been completely ineffective.  Having significant difficulty sleeping due to pain.  I reviewed the past medical history, family history, social history, surgical history, and allergies today and no changes were needed.  Please see the problem list section below in epic for further details.  Past Medical History: Past Medical History:  Diagnosis Date  . Anxiety   . Closed fracture of nasal bone 01/30/2018  . Depression   . Dysautonomia (HCC)   . Dyshidrotic eczema   . Dysmenorrhea   . Migraine   . MVP (mitral valve prolapse)   . Tachycardia   . 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 WISDOM TOOTH EXTRACTION     Social History: Social History   Socioeconomic History  . Marital status: Single    Spouse name: Not on file  .  Number of children: Not on file  . Years of education: Not on file  . Highest education level: Not on file  Occupational History  . Not on file  Tobacco Use  . Smoking status: Former Smoker    Types: Cigarettes    Quit date: 03/10/2014    Years since quitting: 6.2  . Smokeless tobacco: Never Used  Vaping Use  . Vaping Use: Former  Substance and Sexual Activity  . Alcohol use: Never  . Drug use: Not Currently    Types: Marijuana    Comment: last use 07/2018  . Sexual activity: Yes    Birth control/protection: Condom  Other Topics Concern  . Not on file  Social History Narrative  . Not on file   Social Determinants of Health   Financial Resource Strain:   . Difficulty of Paying Living Expenses: Not on file  Food Insecurity:   . Worried About Programme researcher, broadcasting/film/video in the Last Year: Not on file  . Ran Out of Food in the Last Year: Not on file  Transportation Needs:   . Lack of Transportation (Medical): Not on file  . Lack of Transportation (Non-Medical): Not on file  Physical Activity:   . Days of Exercise per Week: Not on file  . Minutes of Exercise per Session: Not on file  Stress:   . Feeling of Stress : Not  on file  Social Connections:   . Frequency of Communication with Friends and Family: Not on file  . Frequency of Social Gatherings with Friends and Family: Not on file  . Attends Religious Services: Not on file  . Active Member of Clubs or Organizations: Not on file  . Attends Banker Meetings: Not on file  . Marital Status: Not on file   Family History: Family History  Problem Relation Age of Onset  . Diabetes Mother   . Mitral valve prolapse Mother   . Stroke Mother   . Atrial fibrillation Mother   . Sleep apnea Mother   . Depression Mother   . Anxiety disorder Mother   . Healthy Father    Allergies: No Known Allergies Medications: See med rec.  Review of Systems: See HPI for pertinent positives and negatives.   Objective:    General:  Well Developed, well nourished, and in no acute distress.  Neuro: Alert and oriented x3.  HEENT: Normocephalic, atraumatic.  Bilateral sclera white, noninjected.  No involvement of the eyelids at this point. Skin: Warm and dry.  Intact vesicles on an erythematous base along the left forehead extending down to the left scalp and scattered just above the left eyebrow.  Swelling over the left temporomandibular joint without erythema or fluctuance.  Cardiac: Regular rate and rhythm, no murmurs rubs or gallops, no lower extremity edema.  Respiratory: Clear to auscultation bilaterally. Not using accessory muscles, speaking in full sentences.  Impression and Recommendations:    1. Herpes zoster without complication Continue Valtrex 1 g 3 times daily until prescription complete.  Sending in Zofran 8 mg ODT every 8 hours as needed for nausea.  Prednisone 12-day taper.  Sending in gabapentin 100 mg 3 times daily for pain control.  Toradol 60 mg IM x1 in office today.  Cautioned patient to monitor for progression of the shingles to involve the eyelid or the eye.  If this happens or if she experiences vision changes, she needs to notify us immediately as she will need urgent evaluation by ophthalmology.  Letter provided to be out of work for the rest of this week and next week. - ketorolac (TORADOL) injection 60 mg  Return in about 2 weeks (around 07/01/2020) for shingles follow up (ok to be virtual). ___________________________________________ Thayer Ohm, DNP, APRN, FNP-BC Primary Care and Sports Medicine [facility] [place]

## 2020-06-18 ENCOUNTER — Encounter: Payer: Self-pay | Admitting: Medical-Surgical

## 2020-07-01 ENCOUNTER — Encounter: Payer: Self-pay | Admitting: Medical-Surgical

## 2020-07-01 ENCOUNTER — Telehealth (INDEPENDENT_AMBULATORY_CARE_PROVIDER_SITE_OTHER): Payer: BLUE CROSS/BLUE SHIELD | Admitting: Medical-Surgical

## 2020-07-01 DIAGNOSIS — B029 Zoster without complications: Secondary | ICD-10-CM

## 2020-07-01 NOTE — Progress Notes (Signed)
Virtual Visit via Video Note  I connected with Mikael Spray on 07/01/20 at 10:10 AM EST by a video enabled telemedicine application and verified that I am speaking with the correct person using two identifiers.   I discussed the limitations of evaluation and management by telemedicine and the availability of in person appointments. The patient expressed understanding and agreed to proceed.  Patient location: home Provider locations: office  Subjective:    CC: Shingles follow-up  HPI: Pleasant 24 year old female presenting via MyChart video visit for shingles follow-up.  She was seen approximately 2 weeks ago with development of shingles along her left forehead and scalp.  The shingles rash did spread to her left eyelid but did not involve the eye.  Today she reports the rashes disappeared but she does still have a lot of tenderness and sensitivity to the left forehead and the left scalp.  She does still have some redness on her scalp with no active blisters.  She was unable to wash her face for approximately 1 week and notes that she has had some whiteheads develop along her forehead but these are not painful.  She originally started with gabapentin 100 mg but noted that the 100 mg dose was not sufficient so she increased it to 200 mg as needed.  She has been able to stop the gabapentin now that her rash is resolved. Her jaw swelling and pain has resolved.  No fevers, chills, or other myalgias.  Past medical history, Surgical history, Family history not pertinant except as noted below, Social history, Allergies, and medications have been entered into the medical record, reviewed, and corrections made.   Review of Systems: See HPI for pertinent positives and negatives.   Objective:    General: Speaking clearly in complete sentences without any shortness of breath.  Alert and oriented x3.  Normal judgment. No apparent acute distress.  Impression and Recommendations:    1. Herpes  zoster without complication Her rash is fully resolved but she does still have some postherpetic neuralgias in the left forehead and temple.  Advised to monitor this and if does not resolve or becomes worse, to contact us for further recommendations.  I discussed the assessment and treatment plan with the patient. The patient was provided an opportunity to ask questions and all were answered. The patient agreed with the plan and demonstrated an understanding of the instructions.   The patient was advised to call back or seek an in-person evaluation if the symptoms worsen or if the condition fails to improve as anticipated.  15 minutes of non-face-to-face time was provided during this encounter.  Return if symptoms worsen or fail to improve.  Thayer Ohm, DNP, APRN, FNP-BC [facility] and Sports Medicine

## 2020-11-12 ENCOUNTER — Other Ambulatory Visit: Payer: Self-pay

## 2020-11-12 ENCOUNTER — Ambulatory Visit (INDEPENDENT_AMBULATORY_CARE_PROVIDER_SITE_OTHER): Payer: 59 | Admitting: Medical-Surgical

## 2020-11-12 ENCOUNTER — Encounter: Payer: Self-pay | Admitting: Medical-Surgical

## 2020-11-12 DIAGNOSIS — G43009 Migraine without aura, not intractable, without status migrainosus: Secondary | ICD-10-CM

## 2020-11-12 DIAGNOSIS — F411 Generalized anxiety disorder: Secondary | ICD-10-CM | POA: Diagnosis not present

## 2020-11-12 DIAGNOSIS — I059 Rheumatic mitral valve disease, unspecified: Secondary | ICD-10-CM | POA: Diagnosis not present

## 2020-11-12 DIAGNOSIS — F39 Unspecified mood [affective] disorder: Secondary | ICD-10-CM

## 2020-11-12 MED ORDER — SERTRALINE HCL 100 MG PO TABS: 100.0000 mg | ORAL_TABLET | Freq: Every day | ORAL | 1 refills | Status: DC

## 2020-11-12 MED ORDER — QUETIAPINE FUMARATE 25 MG PO TABS: ORAL_TABLET | ORAL | 0 refills | Status: DC

## 2020-11-12 MED ORDER — CLONAZEPAM 0.5 MG PO TABS: 0.2500 mg | ORAL_TABLET | Freq: Every day | ORAL | 1 refills | Status: DC | PRN

## 2020-11-12 MED ORDER — METOPROLOL SUCCINATE ER 100 MG PO TB24: 100.0000 mg | ORAL_TABLET | Freq: Every day | ORAL | 1 refills | Status: DC

## 2020-11-12 NOTE — Progress Notes (Signed)
Subjective:    CC: Mood follow-up  HPI: Pleasant 25 year old female presenting today for med follow-up.  She has been doing well since her last visit.  She is currently taking Zoloft 100 mg daily although she did stop this for a brief period.  Taking Depakote 125 mg daily but occasionally has to take 1-1/2 tabs due to continued irritability.  She does use clonazepam and has used it more recently taking 1/2 tablet to 3 times weekly rather than using approximately 1 or 2 doses a month.  She is taking metoprolol XL 100 mg daily, tolerating well with good control of palpitations.  She does still have some episodes that she refers to as palpitations but these manifest as left-sided chest pain around the lateral side of her breast rather than sensations of a racing heart or fluttering.  When she feels this pain, she usually will take the clonazepam because if she does not, she gets very anxious due to her history of heart problems.  Denies SI/HI  I reviewed the past medical history, family history, social history, surgical history, and allergies today and no changes were needed.  Please see the problem list section below in epic for further details.  Past Medical History: Past Medical History:  Diagnosis Date  . Anxiety   . Closed fracture of nasal bone 01/30/2018  . Depression   . Dysautonomia (HCC)   . Dyshidrotic eczema   . Dysmenorrhea   . Migraine   . MVP (mitral valve prolapse)   . Tachycardia   . 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 WISDOM TOOTH EXTRACTION     Social History: Social History   Socioeconomic History  . Marital status: Single    Spouse name: Not on file  . Number of children: Not on file  . Years of education: Not on file  . Highest education level: Not on file  Occupational History  . Not on file  Tobacco Use  . Smoking status:  Former Smoker    Types: Cigarettes    Quit date: 03/10/2014    Years since quitting: 6.6  . Smokeless tobacco: Never Used  Vaping Use  . Vaping Use: Former  Substance and Sexual Activity  . Alcohol use: Never  . Drug use: Not Currently    Types: Marijuana    Comment: last use 07/2018  . Sexual activity: Yes    Birth control/protection: Condom  Other Topics Concern  . Not on file  Social History Narrative  . Not on file   Social Determinants of Health   Financial Resource Strain: Not on file  Food Insecurity: Not on file  Transportation Needs: Not on file  Physical Activity: Not on file  Stress: Not on file  Social Connections: Not on file   Family History: Family History  Problem Relation Age of Onset  . Diabetes Mother   . Mitral valve prolapse Mother   . Stroke Mother   . Atrial fibrillation Mother   . Sleep apnea Mother   . Depression Mother   . Anxiety disorder Mother   . Healthy Father    Allergies: No Known Allergies Medications: See med rec.  Review of Systems: See HPI for pertinent positives and negatives.   Objective:    General: Well Developed, well nourished, and in no acute distress.  Neuro: Alert and oriented x3.  HEENT: Normocephalic, atraumatic.  Skin: Warm and  dry. Cardiac: Regular rate and rhythm, no murmurs rubs or gallops, no lower extremity edema.  Respiratory: Clear to auscultation bilaterally. Not using accessory muscles, speaking in full sentences.  Impression and Recommendations:    1. Generalized anxiety disorder 2. Mood disorder (HCC) Continue sertraline 100 mg daily.  Adding Seroquel 25 mg for the first week then 50 mg nightly thereafter to see if this gives better mood stabilization the Depakote.  Advised not to take Seroquel and Depakote together.  If she does not tolerate Seroquel for some reason, advised her to reach out but okay to restart Depakote.  Advised very sparing use of clonazepam and discussed other measures to take  when feeling anxious about her chest pain/palpitations. - sertraline (ZOLOFT) 100 MG tablet; Take 1 tablet (100 mg total) by mouth daily.  Dispense: 90 tablet; Refill: 1 - clonazePAM (KLONOPIN) 0.5 MG tablet; Take 0.5-1 tablets (0.25-0.5 mg total) by mouth daily as needed for anxiety (panic attacks).  Dispense: 20 tablet; Refill: 1  3. Mitral valve disorder Continue Toprol XL 100 mg daily. - metoprolol succinate (TOPROL-XL) 100 MG 24 hr tablet; Take 1 tablet (100 mg total) by mouth daily.  Dispense: 90 tablet; Refill: 1  Return in about 4 weeks (around 12/10/2020) for mood follow up (virtual is fine). ___________________________________________ Thayer Ohm, DNP, APRN, FNP-BC Primary Care and Sports Medicine [facility]

## 2020-12-09 NOTE — Progress Notes (Signed)
Subjective:    CC: mood follow up  HPI: Pleasant 25 year old female presenting today for follow-up on mood.  Approximately 4 weeks ago, we made the switch from Depakote to Seroquel since she had increasing irritability and mood swings.  She successfully made the switch to Seroquel 25 mg nightly.  Has been tolerating this medication well without side effects.  Since the 25 mg worked so well, she did not increase the dose to 50 mg nightly as previously discussed.  Notes that her mood is greatly improved.  She is sleeping well and her irritability has resolved.  She has had fewer episodes of panic and has been able to talk herself down from those without relying on clonazepam.  Denies SI/HI.  I reviewed the past medical history, family history, social history, surgical history, and allergies today and no changes were needed.  Please see the problem list section below in epic for further details.  Past Medical History: Past Medical History:  Diagnosis Date  . Anxiety   . Closed fracture of nasal bone 01/30/2018  . Depression   . Dysautonomia (HCC)   . Dyshidrotic eczema   . Dysmenorrhea   . Migraine   . MVP (mitral valve prolapse)   . Tachycardia   . 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 WISDOM TOOTH EXTRACTION     Social History: Social History   Socioeconomic History  . Marital status: Single    Spouse name: Not on file  . Number of children: Not on file  . Years of education: Not on file  . Highest education level: Not on file  Occupational History  . Not on file  Tobacco Use  . Smoking status: Former Smoker    Types: Cigarettes    Quit date: 03/10/2014    Years since quitting: 6.7  . Smokeless tobacco: Never Used  Vaping Use  . Vaping Use: Former  Substance and Sexual Activity  . Alcohol use: Never  . Drug use: Not Currently    Types:  Marijuana    Comment: last use 07/2018  . Sexual activity: Yes    Birth control/protection: Condom  Other Topics Concern  . Not on file  Social History Narrative  . Not on file   Social Determinants of Health   Financial Resource Strain: Not on file  Food Insecurity: Not on file  Transportation Needs: Not on file  Physical Activity: Not on file  Stress: Not on file  Social Connections: Not on file   Family History: Family History  Problem Relation Age of Onset  . Diabetes Mother   . Mitral valve prolapse Mother   . Stroke Mother   . Atrial fibrillation Mother   . Sleep apnea Mother   . Depression Mother   . Anxiety disorder Mother   . Healthy Father    Allergies: No Known Allergies Medications: See med rec.  Review of Systems: See HPI for pertinent positives and negatives.   Objective:    General: Well Developed, well nourished, and in no acute distress.  Neuro: Alert and oriented x3.  HEENT: Normocephalic, atraumatic.  Skin: Warm and dry. Cardiac: Regular rate and rhythm.  Respiratory:  Not using accessory muscles, speaking in full sentences.   Impression and Recommendations:    1. Generalized anxiety disorder 2. Mood disorder (HCC) Symptoms well controlled. Continue Zoloft 100 mg daily.  Continues Seroquel 25 mg nightly.  Return in about 6 months (around 06/11/2021) for mood follow up. ___________________________________________ Thayer Ohm, DNP, APRN, FNP-BC Primary Care and Sports Medicine [facility]

## 2020-12-10 ENCOUNTER — Other Ambulatory Visit: Payer: Self-pay

## 2020-12-10 ENCOUNTER — Ambulatory Visit (INDEPENDENT_AMBULATORY_CARE_PROVIDER_SITE_OTHER): Payer: 59 | Admitting: Medical-Surgical

## 2020-12-10 ENCOUNTER — Encounter: Payer: Self-pay | Admitting: Medical-Surgical

## 2020-12-10 VITALS — BP 98/61 | HR 78 | Temp 98.1°F | Ht 61.5 in | Wt 128.2 lb

## 2020-12-10 DIAGNOSIS — F39 Unspecified mood [affective] disorder: Secondary | ICD-10-CM

## 2020-12-10 DIAGNOSIS — F411 Generalized anxiety disorder: Secondary | ICD-10-CM

## 2020-12-10 MED ORDER — QUETIAPINE FUMARATE 25 MG PO TABS: 25.0000 mg | ORAL_TABLET | Freq: Every day | ORAL | 1 refills | Status: DC

## 2021-01-13 ENCOUNTER — Other Ambulatory Visit: Payer: Self-pay

## 2021-01-13 ENCOUNTER — Encounter: Payer: Self-pay | Admitting: Medical-Surgical

## 2021-01-13 ENCOUNTER — Emergency Department
Admission: RE | Admit: 2021-01-13 | Discharge: 2021-01-13 | Disposition: A | Discharge status: Home / Self Care | Payer: 59 | Source: Ambulatory Visit

## 2021-01-13 VITALS — BP 107/71 | HR 116 | Temp 98.9°F | Resp 16 | Ht 61.5 in | Wt 124.0 lb

## 2021-01-13 DIAGNOSIS — R509 Fever, unspecified: Secondary | ICD-10-CM

## 2021-01-13 DIAGNOSIS — R52 Pain, unspecified: Secondary | ICD-10-CM

## 2021-01-13 DIAGNOSIS — U071 COVID-19: Secondary | ICD-10-CM

## 2021-01-13 LAB — POC SARS CORONAVIRUS 2 AG -  ED: SARS Coronavirus 2 Ag: POSITIVE — AB

## 2021-01-13 MED ORDER — NIRMATRELVIR/RITONAVIR (PAXLOVID)TABLET: 3.0000 | ORAL_TABLET | Freq: Two times a day (BID) | ORAL | 0 refills | Status: AC

## 2021-01-13 NOTE — ED Triage Notes (Signed)
Fever of 101.2 at 1430 - took Tylenol (1gm)  Fever started at 4pm yesterday w/ body aches  Pt's temp is now down to 98.9 Decreased appetite  No COVID vaccine Home COVID test yesterday was negative
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
## 2021-01-13 NOTE — Discharge Instructions (Addendum)
Advised/instructed patient to take medication as directed with food to completion.  Encourage patient to increase daily water intake while taking Paxil elevated.  Advised patient may alternate between Tylenol (1000 mg) and Ibuprofen (600 mg) daily, as needed.  Advised/encouraged patient to quarantine for the next 10 days.  Work note provided.

## 2021-06-10 ENCOUNTER — Ambulatory Visit: Payer: 59 | Admitting: Medical-Surgical

## 2021-06-12 ENCOUNTER — Other Ambulatory Visit: Payer: Self-pay

## 2021-06-12 ENCOUNTER — Emergency Department: Admit: 2021-06-12 | Payer: Self-pay

## 2021-06-12 ENCOUNTER — Encounter: Payer: Self-pay | Admitting: Emergency Medicine

## 2021-06-12 ENCOUNTER — Emergency Department
Admission: EM | Admit: 2021-06-12 | Discharge: 2021-06-12 | Disposition: A | Discharge status: Home / Self Care | Payer: 59 | Source: Home / Self Care | Attending: Family Medicine | Admitting: Family Medicine

## 2021-06-12 DIAGNOSIS — J0100 Acute maxillary sinusitis, unspecified: Secondary | ICD-10-CM

## 2021-06-12 MED ORDER — FLUCONAZOLE 150 MG PO TABS: 150.0000 mg | ORAL_TABLET | Freq: Every day | ORAL | 0 refills | Status: DC

## 2021-06-12 MED ORDER — AMOXICILLIN 875 MG PO TABS: 875.0000 mg | ORAL_TABLET | Freq: Two times a day (BID) | ORAL | 0 refills | Status: AC
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
## 2021-06-12 NOTE — Discharge Instructions (Signed)
Continue to drink lots of water Take the amoxicillin 3 times a day Consider Mucinex to loosen the mucus

## 2021-06-12 NOTE — ED Triage Notes (Signed)
Patient presents to Urgent Care with complaints of dry cough, sinus pressure, runny nose-clear, and fatigued since 3-4 days. Patient reports slight fever of 99.67F. Taking Tylenol, Alka Seltzer cold and flu and cough drops. Works at [employer]. Unknown sick contacts.Marland Kitchen

## 2021-07-03 ENCOUNTER — Other Ambulatory Visit: Payer: Self-pay | Admitting: Medical-Surgical

## 2021-07-03 DIAGNOSIS — F411 Generalized anxiety disorder: Secondary | ICD-10-CM

## 2021-07-04 NOTE — Telephone Encounter (Signed)
Last OV 12/10/20, due for mood follow-up

## 2022-03-17 ENCOUNTER — Encounter: Payer: Self-pay | Admitting: Emergency Medicine

## 2022-03-17 ENCOUNTER — Ambulatory Visit
Admission: EM | Admit: 2022-03-17 | Discharge: 2022-03-17 | Disposition: A | Discharge status: Home / Self Care | Payer: BC Managed Care – PPO | Attending: Family Medicine | Admitting: Family Medicine

## 2022-03-17 ENCOUNTER — Ambulatory Visit: Admit: 2022-03-17 | Discharge status: Absent without leave | Payer: Self-pay

## 2022-03-17 DIAGNOSIS — B9789 Other viral agents as the cause of diseases classified elsewhere: Secondary | ICD-10-CM | POA: Insufficient documentation

## 2022-03-17 DIAGNOSIS — J069 Acute upper respiratory infection, unspecified: Secondary | ICD-10-CM | POA: Diagnosis not present

## 2022-03-17 DIAGNOSIS — Z87891 Personal history of nicotine dependence: Secondary | ICD-10-CM | POA: Diagnosis not present

## 2022-03-17 DIAGNOSIS — Z20822 Contact with and (suspected) exposure to covid-19: Secondary | ICD-10-CM | POA: Diagnosis not present

## 2022-03-17 DIAGNOSIS — U071 COVID-19: Secondary | ICD-10-CM | POA: Diagnosis not present

## 2022-03-17 DIAGNOSIS — J0100 Acute maxillary sinusitis, unspecified: Secondary | ICD-10-CM | POA: Insufficient documentation

## 2022-03-17 DIAGNOSIS — H6692 Otitis media, unspecified, left ear: Secondary | ICD-10-CM | POA: Diagnosis not present

## 2022-03-17 DIAGNOSIS — H9202 Otalgia, left ear: Secondary | ICD-10-CM | POA: Diagnosis present

## 2022-03-17 MED ORDER — AMOXICILLIN 875 MG PO TABS: ORAL_TABLET | ORAL | 0 refills | Status: DC

## 2022-03-17 NOTE — ED Provider Notes (Signed)
[facility]    CSN: [id number] Arrival date & time: 03/17/22  1938      History   Chief Complaint Chief Complaint  Patient presents with   Otalgia    HPI [name] is a 26 y.o. female.   Three days ago patient developed mild sore throat, headache, and sinus congestion.  Yesterday she developed fever, left facial pain and left earache with muffled hearing.  Her earache became worse today.  She denies cough.  The history is provided by the patient.    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 (01/21/18)    Patient Active Problem List   Diagnosis Date Noted   High triglycerides 11/12/2019   Tachycardia 10/28/2019   Physiological ovarian cysts 10/31/2018   Breast mass, left 10/31/2018   On valproate therapy 10/23/2018   Medication monitoring encounter 10/23/2018   TBI (traumatic brain injury) (HCC) 05/06/2018   Neck pain with neurological deficit after whiplash injury to neck 05/06/2018   Gallbladder colic 05/06/2018   Dizziness due to old head trauma 05/06/2018   Postconcussion syndrome 05/06/2018   Generalized hyperreflexia 01/31/2018   Migraine without aura and without status migrainosus, not intractable 01/31/2018   Mood disorder (HCC) 01/31/2018   Dyshidrotic eczema    Dysmenorrhea    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      Home Medications    Prior to  Admission medications   Medication Sig Start Date End Date Taking? Authorizing Provider  amoxicillin (AMOXIL) 875 MG tablet Take one tab PO Q12hr 03/17/22  Yes Arnie Maiolo, Tera Mater, MD  clonazePAM (KLONOPIN) 0.5 MG tablet Take 0.5-1 tablets (0.25-0.5 mg total) by mouth daily as needed for anxiety (panic attacks). 11/12/20  Yes Christen Butter, NP  fluconazole (DIFLUCAN) 150 MG tablet Take 1 tablet (150 mg total) by mouth daily. Repeat in 1 week if needed 06/12/21  Yes Eustace Moore, MD  gabapentin (NEURONTIN) 100 MG capsule Take 1 capsule (100 mg total) by mouth 3 (three) times daily. 06/17/20  Yes Christen Butter, NP  Magnesium 100 MG CAPS Take by mouth.   Yes [provider]  metoprolol succinate (TOPROL-XL) 100 MG 24 hr tablet Take 1 tablet (100 mg total) by mouth daily. 11/12/20  Yes Christen Butter, NP  Omega-3 1000 MG CAPS Take by mouth.   Yes [provider]  QUEtiapine (SEROQUEL) 25 MG tablet Take 1 tablet (25 mg total) by mouth at bedtime. 12/10/20  Yes Christen Butter, NP  sertraline (ZOLOFT) 100 MG tablet Take 1 tablet (100 mg total) by mouth daily. 11/12/20  Yes Christen Butter, NP  Vitamin D-Vitamin K (K2 PLUS D3 PO) Take by mouth.   Yes [provider]    Family History Family History  Problem Relation Age of Onset   Diabetes Mother    Mitral valve prolapse Mother  Stroke Mother    Atrial fibrillation Mother    Sleep apnea Mother    Depression Mother    Anxiety disorder Mother    Healthy Father     Social History Social History   Tobacco Use   Smoking status: Former    Types: Cigarettes    Quit date: 03/10/2014    Years since quitting: 8.0   Smokeless tobacco: Never  Vaping Use   Vaping Use: Former  Substance Use Topics   Alcohol use: Never   Drug use: Not Currently    Types: Marijuana    Comment: last use 07/2018     Allergies   Patient has no known allergies.   Review of Systems Review of Systems + sore throat No cough No pleuritic pain No  wheezing + nasal congestion + post-nasal drainage + sinus pain/pressure No itchy/red eyes + left earache No hemoptysis No SOB + fever No nausea No vomiting No abdominal pain No diarrhea No urinary symptoms No skin rash + fatigue No myalgias + headache Used OTC meds (Sudafed and ibuprofen) without relief   Physical Exam Triage Vital Signs ED Triage Vitals  Enc Vitals Group     BP 03/17/22 1951 120/85     Pulse Rate 03/17/22 1951 86     Resp 03/17/22 1951 18     Temp 03/17/22 1951 98.2 F (36.8 C)     Temp Source 03/17/22 1951 Oral     SpO2 03/17/22 1951 95 %     Weight 03/17/22 1952 125 lb (56.7 kg)     Height 03/17/22 1952 5\' 2"  (1.575 m)     Head Circumference --      Peak Flow --      Pain Score 03/17/22 1952 3     Pain Loc --      Pain Edu? --      Excl. in [place]? --    No data found.  Updated Vital Signs BP 120/85 (BP Location: Right Arm)   Pulse 86   Temp 98.2 F (36.8 C) (Oral)   Resp 18   Ht 5\' 2"  (1.575 m)   Wt 56.7 kg   LMP 03/09/2022   SpO2 95%   BMI 22.86 kg/m   Visual Acuity Right Eye Distance:   Left Eye Distance:   Bilateral Distance:    Right Eye Near:   Left Eye Near:    Bilateral Near:     Physical Exam Nursing notes and Vital Signs reviewed. Appearance:  Patient appears stated age, and in no acute distress Eyes:  Pupils are equal, round, and reactive to light and accomodation.  Extraocular movement is intact.  Conjunctivae are not inflamed  Ears:  Canals normal.  Right tympanic membrane normal.  Left tympanic membrane pink with decreased landmarks. Nose:  Mildly congested turbinates.  Left maxillary sinus tenderness is present.  Pharynx:  Normal Neck:  Supple.  Mildly enlarged lateral nodes are present, tender to palpation on the left.   Lungs:  Clear to auscultation.  Breath sounds are equal.  Moving air well. Heart:  Regular rate and rhythm without murmurs, rubs, or gallops.  Abdomen:  Nontender without masses or  hepatosplenomegaly.  Bowel sounds are present.  No CVA or flank tenderness.  Extremities:  No edema.  Skin:  No rash present.   UC Treatments / Results  Labs (all labs ordered are listed, but only abnormal results are displayed) Labs Reviewed  SARS-COV-2 RNA,(COVID-19) QUALITATIVE NAAT    EKG   Radiology  No results found.  Procedures Procedures (including critical care time)  Medications Ordered in UC Medications - No data to display  Initial Impression / Assessment and Plan / UC Course  I have reviewed the triage vital signs and the nursing notes.  Pertinent labs & imaging results that were available during my care of the patient were reviewed by me and considered in my medical decision making (see chart for details).    Begin amoxicillin. Check COVID19 PCR. Followup with Family Doctor if not improved in about 10 days.  Final Clinical Impressions(s) / UC Diagnoses   Final diagnoses:  Viral URI  Acute left otitis media  Acute maxillary sinusitis, recurrence not specified     Discharge Instructions      Take plain guaifenesin (1200mg  extended release tabs such as Mucinex) twice daily, with plenty of water, for cough and congestion.  May add Pseudoephedrine (30mg , one or two every 4 to 6 hours) for sinus congestion.  Get adequate rest.   May use Afrin nasal spray (or generic oxymetazoline) each morning for about 5 days and then discontinue.  Also recommend using saline nasal spray several times daily and saline nasal irrigation (AYR is a common brand).  Use Flonase nasal spray each morning after using Afrin nasal spray and saline nasal irrigation. Try warm salt water gargles for sore throat.  Stop all antihistamines for now, and other non-prescription cough/cold preparations. If cough develops, may take Delsym Cough Suppressant ("12 Hour Cough Relief") at bedtime for nighttime cough.       ED Prescriptions     Medication Sig Dispense Auth. Provider   amoxicillin  (AMOXIL) 875 MG tablet Take one tab PO Q12hr 14 tablet , MD         , MD 03/19/22 1135

## 2022-03-17 NOTE — ED Triage Notes (Signed)
Patient c/o fever x 1 day, head and facial pressure, now severe left ear pain today.  Patient has been taken Sudafed and Ibuprofen.

## 2022-03-17 NOTE — Discharge Instructions (Signed)
Take plain guaifenesin (1200mg  extended release tabs such as Mucinex) twice daily, with plenty of water, for cough and congestion.  May add Pseudoephedrine (30mg , one or two every 4 to 6 hours) for sinus congestion.  Get adequate rest.   May use Afrin nasal spray (or generic oxymetazoline) each morning for about 5 days and then discontinue.  Also recommend using saline nasal spray several times daily and saline nasal irrigation (AYR is a common brand).  Use Flonase nasal spray each morning after using Afrin nasal spray and saline nasal irrigation. Try warm salt water gargles for sore throat.  Stop all antihistamines for now, and other non-prescription cough/cold preparations. If cough develops, may take Delsym Cough Suppressant ("12 Hour Cough Relief") at bedtime for nighttime cough.

## 2022-03-19 LAB — NOVEL CORONAVIRUS, NAA (HOSP ORDER, SEND-OUT TO REF LAB; TAT 18-24 HRS): SARS-CoV-2, NAA: DETECTED — AB

## 2022-10-06 ENCOUNTER — Ambulatory Visit: Payer: BC Managed Care – PPO

## 2022-10-28 ENCOUNTER — Emergency Department: Payer: BC Managed Care – PPO

## 2022-10-28 ENCOUNTER — Other Ambulatory Visit: Payer: Self-pay

## 2022-10-28 ENCOUNTER — Encounter: Payer: Self-pay | Admitting: Emergency Medicine

## 2022-10-28 ENCOUNTER — Ambulatory Visit
Admission: EM | Admit: 2022-10-28 | Discharge: 2022-10-28 | Disposition: A | Discharge status: Home / Self Care | Payer: BC Managed Care – PPO | Attending: Family Medicine | Admitting: Family Medicine

## 2022-10-28 ENCOUNTER — Emergency Department
Admission: EM | Admit: 2022-10-28 | Discharge: 2022-10-28 | Disposition: A | Discharge status: Home / Self Care | Payer: BC Managed Care – PPO | Attending: Student in an Organized Health Care Education/Training Program | Admitting: Student in an Organized Health Care Education/Training Program

## 2022-10-28 DIAGNOSIS — R519 Headache, unspecified: Secondary | ICD-10-CM

## 2022-10-28 DIAGNOSIS — Z9189 Other specified personal risk factors, not elsewhere classified: Secondary | ICD-10-CM

## 2022-10-28 LAB — CBC WITH DIFFERENTIAL/PLATELET
Abs Immature Granulocytes: 0.05 10*3/uL (ref 0.00–0.07)
Basophils Absolute: 0.1 10*3/uL (ref 0.0–0.1)
Basophils Relative: 1 %
Eosinophils Absolute: 0.2 10*3/uL (ref 0.0–0.5)
Eosinophils Relative: 2 %
HCT: 44.3 % (ref 36.0–46.0)
Hemoglobin: 14.9 g/dL (ref 12.0–15.0)
Immature Granulocytes: 0 %
Lymphocytes Relative: 22 %
Lymphs Abs: 2.6 10*3/uL (ref 0.7–4.0)
MCH: 30.7 pg (ref 26.0–34.0)
MCHC: 33.6 g/dL (ref 30.0–36.0)
MCV: 91.3 fL (ref 80.0–100.0)
Monocytes Absolute: 0.8 10*3/uL (ref 0.1–1.0)
Monocytes Relative: 7 %
Neutro Abs: 7.9 10*3/uL — ABNORMAL HIGH (ref 1.7–7.7)
Neutrophils Relative %: 68 %
Platelets: 401 10*3/uL — ABNORMAL HIGH (ref 150–400)
RBC: 4.85 MIL/uL (ref 3.87–5.11)
RDW: 12.6 % (ref 11.5–15.5)
WBC: 11.6 10*3/uL — ABNORMAL HIGH (ref 4.0–10.5)
nRBC: 0 % (ref 0.0–0.2)

## 2022-10-28 LAB — BASIC METABOLIC PANEL
Anion gap: 5 (ref 5–15)
BUN: 12 mg/dL (ref 6–20)
CO2: 24 mmol/L (ref 22–32)
Calcium: 9.2 mg/dL (ref 8.9–10.3)
Chloride: 107 mmol/L (ref 98–111)
Creatinine, Ser: 0.7 mg/dL (ref 0.44–1.00)
GFR, Estimated: >60 mL/min (ref >60)
Glucose, Bld: 125 mg/dL — ABNORMAL HIGH (ref 70–99)
Potassium: 3.5 mmol/L (ref 3.5–5.1)
Sodium: 136 mmol/L (ref 135–145)

## 2022-10-28 LAB — POC URINE PREG, ED: Preg Test, Ur: NEGATIVE

## 2022-10-28 LAB — SEDIMENTATION RATE: Sed Rate: 51 mm/hr — ABNORMAL HIGH (ref 0–20)

## 2022-10-28 MED ORDER — KETOROLAC TROMETHAMINE 30 MG/ML IJ SOLN
15.0000 mg | Freq: Once | INTRAMUSCULAR | Status: DC
  Filled 2022-10-28: qty 1

## 2022-10-28 MED ORDER — PROCHLORPERAZINE EDISYLATE 10 MG/2ML IJ SOLN
10.0000 mg | Freq: Once | INTRAMUSCULAR | Status: AC
  Administered 2022-10-28: 10 mg via INTRAVENOUS
  Filled 2022-10-28: qty 2

## 2022-10-28 MED ORDER — DIPHENHYDRAMINE HCL 50 MG/ML IJ SOLN
12.5000 mg | Freq: Once | INTRAMUSCULAR | Status: AC
  Administered 2022-10-28: 12.5 mg via INTRAVENOUS
  Filled 2022-10-28: qty 1

## 2022-10-28 NOTE — Discharge Instructions (Signed)

## 2022-10-28 NOTE — ED Provider Notes (Signed)
[facility]    CSN: [id number] Arrival date & time: 10/28/22  1433      History   Chief Complaint Chief Complaint  Patient presents with   Headache    HPI Christina Wilson is a 27 y.o. female.   HPI   Christina Wilson presents for right frontal headache that started all of a sudden while shopping at [place] today. Had a bad head injury around 2020 with brain bleed but that was on the left front.  Feels like she has "brain freeze" with stabbing and sharp pain. Ha similar pain on Thursday that went away with Tylenol and Ibuprofen.  She has history of migraines but not like this.  This is a new headache for her.      Past Medical History:  Diagnosis Date   Anxiety    Closed fracture of nasal bone 01/30/2018   Depression    Dysautonomia ([place])    Dyshidrotic eczema    Dysmenorrhea    Migraine    MVP (mitral valve prolapse)    Tachycardia    Traumatic cerebral parenchymal hemorrhage ([place]) 01/30/2018   Left frontal lobe (01/21/18)    Patient Active Problem List   Diagnosis Date Noted   High triglycerides 11/12/2019   Tachycardia 10/28/2019   Physiological ovarian cysts 10/31/2018   Breast mass, left 10/31/2018   On valproate therapy 10/23/2018   Medication monitoring encounter 10/23/2018   TBI (traumatic brain injury) ([place]) 05/06/2018   Neck pain with neurological deficit after whiplash injury to neck 05/06/2018   Gallbladder colic 99991111   Dizziness due to old head trauma 05/06/2018   Postconcussion syndrome 05/06/2018   Generalized hyperreflexia 01/31/2018   Migraine without aura and without status migrainosus, not intractable 01/31/2018   Mood disorder ([place]) 01/31/2018   Dyshidrotic eczema    Dysmenorrhea    Traumatic cerebral parenchymal hemorrhage ([place]) 01/30/2018   Closed fracture of nasal bone 01/30/2018   Familial dysautonomia ([place]) 06/26/2017   Generalized anxiety disorder 06/12/2016   Mitral valve disorder 06/12/2016    Past Surgical  History:  Procedure Laterality Date   WISDOM TOOTH EXTRACTION      OB History   No obstetric history on file.      Home Medications    Prior to Admission medications   Medication Sig Start Date End Date Taking? Authorizing Provider  amoxicillin (AMOXIL) 875 MG tablet Take one tab PO Q12hr 03/17/22   Kandra Nicolas, MD  clonazePAM (KLONOPIN) 0.5 MG tablet Take 0.5-1 tablets (0.25-0.5 mg total) by mouth daily as needed for anxiety (panic attacks). 11/12/20   Samuel Bouche, NP  fluconazole (DIFLUCAN) 150 MG tablet Take 1 tablet (150 mg total) by mouth daily. Repeat in 1 week if needed 06/12/21   Raylene Everts, MD  gabapentin (NEURONTIN) 100 MG capsule Take 1 capsule (100 mg total) by mouth 3 (three) times daily. 06/17/20   Samuel Bouche, NP  Magnesium 100 MG CAPS Take by mouth.    [provider]  metoprolol succinate (TOPROL-XL) 100 MG 24 hr tablet Take 1 tablet (100 mg total) by mouth daily. 11/12/20   Samuel Bouche, NP  Omega-3 1000 MG CAPS Take by mouth.    [provider]  QUEtiapine (SEROQUEL) 25 MG tablet Take 1 tablet (25 mg total) by mouth at bedtime. 12/10/20   Samuel Bouche, NP  sertraline (ZOLOFT) 100 MG tablet Take 1 tablet (100 mg total) by mouth daily. 11/12/20   Samuel Bouche, NP  Vitamin D-Vitamin K (K2  PLUS D3 PO) Take by mouth.    [provider]    Family History Family History  Problem Relation Age of Onset   Diabetes Mother    Mitral valve prolapse Mother    Stroke Mother    Atrial fibrillation Mother    Sleep apnea Mother    Depression Mother    Anxiety disorder Mother    Healthy Father     Social History Social History   Tobacco Use   Smoking status: Former    Types: Cigarettes    Quit date: 03/10/2014    Years since quitting: 8.6   Smokeless tobacco: Never  Vaping Use   Vaping Use: Former  Substance Use Topics   Alcohol use: Never   Drug use: Not Currently    Types: Marijuana    Comment: last use 07/2018     Allergies    Patient has no known allergies.   Review of Systems Review of Systems: negative unless otherwise stated in HPI.      Physical Exam Triage Vital Signs ED Triage Vitals  Enc Vitals Group     BP 10/28/22 1446 (!) 140/99     Pulse Rate 10/28/22 1446 (!) 110     Resp 10/28/22 1446 14     Temp 10/28/22 1446 98 F (36.7 C)     Temp Source 10/28/22 1446 Oral     SpO2 10/28/22 1446 99 %     Weight 10/28/22 1444 125 lb (56.7 kg)     Height 10/28/22 1444 5\' 2"  (1.575 m)     Head Circumference --      Peak Flow --      Pain Score 10/28/22 1443 8     Pain Loc --      Pain Edu? --      Excl. in [place]? --    No data found.  Updated Vital Signs BP (!) 140/99 (BP Location: Left Arm)   Pulse (!) 110   Temp 98 F (36.7 C) (Oral)   Resp 14   Ht 5\' 2"  (1.575 m)   Wt 56.7 kg   LMP 10/06/2022 (Approximate)   SpO2 99%   BMI 22.86 kg/m   Visual Acuity Right Eye Distance:   Left Eye Distance:   Bilateral Distance:    Right Eye Near:   Left Eye Near:    Bilateral Near:     Physical Exam GEN:     alert, uncomfortable and teary female HENT:  mucus membranes moist, nares patent, no nasal discharge  EYES:   pupils equal and reactive and accommodation , EOM intact NECK:  supple, normal ROM RESP:  clear to auscultation bilaterally, no increased work of breathing  CVS:   regular rate and rhythm, distal pulses intact  EXT:   normal ROM, atraumatic NEURO:  alert, oriented, speech normal, CN 2-12 grossly intact, no facial droop,  sensation grossly intact, strength 5/5 bilateral UE and LE, normal coordination Skin:   warm and dry Psych:  appropriate speech and behavior      UC Treatments / Results  Labs (all labs ordered are listed, but only abnormal results are displayed) Labs Reviewed - No data to display  EKG   Radiology No results found.  Procedures Procedures (including critical care time)  Medications Ordered in UC Medications - No data to display  Initial  Impression / Assessment and Plan / UC Course  I have reviewed the triage vital signs and the nursing notes.  Pertinent labs & imaging  results that were available during my care of the patient were reviewed by me and considered in my medical decision making (see chart for details).       Patient is a 27 y.o. female with history of migraines and intracranial bleed who presents for acute onset headache.  Overall patient is uncomfortable-appearing.  She is mildly hypertensive and tachycardic.  On chart review, she had a CT head in June 2019 that showed parenchymal hemorrhage of the left frontal lobe.  This was after a fall.  She reports history of migraines but this pain is new.  Given this history and her symptoms recommended ED evaluation for head imaging as we are not able to perform that here today.  Patient is agreeable.  She does not want to go by EMS and her female family member/friend  will drive her to the hospital.  [facility] ED and spoke with the triage RN who will await patient's arrival.     Discussed MDM, treatment plan and plan for follow-up with patient who agrees with plan.    Final Clinical Impressions(s) / UC Diagnoses   Final diagnoses:  Bad headache  At risk for intracranial bleeding     Discharge Instructions      You have been advised to follow up immediately in the emergency department for concerning signs or symptoms as discussed during your visit. If you declined EMS transport, please have a family member take you directly to the ED at this time. Do not delay.   Based on concerns about condition, if you do not follow up in the ED, you may risk poor outcomes including worsening of condition, delayed treatment and potentially life threatening issues. If you have declined to go to the ED at this time, you should call your PCP immediately to set up a follow up appointment.   Go to ED for red flag symptoms, including; fevers you cannot  reduce with Tylenol/Motrin, severe headaches, vision changes, numbness/weakness in part of the body, lethargy, confusion, intractable vomiting, severe dehydration, chest pain, breathing difficulty, severe persistent abdominal or pelvic pain, signs of severe infection (increased redness, swelling of an area), feeling faint or passing out, dizziness, etc. You should especially go to the ED for sudden acute worsening of condition if you do not elect to go at this time.       ED Prescriptions   None    PDMP not reviewed this encounter.   Lyndee Hensen, DO 10/28/22 1533

## 2022-10-28 NOTE — Discharge Instructions (Signed)

## 2022-10-28 NOTE — ED Triage Notes (Signed)
Pt to ER with c/o sever headache located to right side of head.  Pt states had similar headache 3 days ago.  Pt states hx of "brain bleed".  Pt reports headache was sudden onset, describes as a sharp pressure.  Pt also has hx of migraines.  States this headache is not her typical migraine.

## 2022-10-28 NOTE — ED Provider Notes (Signed)
[facility] Provider Note    Event Date/Time   First MD Initiated Contact with Patient 10/28/22 1733     (approximate)   History   Headache   HPI  Christina Wilson is a 27 y.o. female with a history of migraines frequent headaches as well as remote history of TBI back in 2019 presents to the ER for acute headache particularly on the right side.  States he had similar sudden onset headache 3 days ago which was served she was out shopping today and felt it occur again says it feels different from her migraines no significant nausea no fevers no cough or chills no chest pain or shortness of breath.  No numbness or tingling.  No blurry vision.  States the pain is already subsiding.     Physical Exam   Triage Vital Signs: ED Triage Vitals [10/28/22 1555]  Enc Vitals Group     BP (!) 144/89     Pulse Rate 100     Resp 18     Temp 98.1 F (36.7 C)     Temp Source Oral     SpO2 97 %     Weight      Height      Head Circumference      Peak Flow      Pain Score 7     Pain Loc      Pain Edu?      Excl. in [place]?     Most recent vital signs: Vitals:   10/28/22 1730 10/28/22 1830  BP: 123/87 107/76  Pulse: 90 89  Resp:    Temp:    SpO2: 100% 100%     Constitutional: Alert  Eyes: Conjunctivae are normal.  Head: Atraumatic. Nose: No congestion/rhinnorhea. Mouth/Throat: Mucous membranes are moist.   Neck: Painless ROM.  Cardiovascular:   Good peripheral circulation. Respiratory: Normal respiratory effort.  No retractions.  Gastrointestinal: Soft and nontender.  Musculoskeletal:  no deformity Neurologic:  MAE spontaneously. No gross focal neurologic deficits are appreciated.  Skin:  Skin is warm, dry and intact. No rash noted. Psychiatric: Mood and affect are normal. Speech and behavior are normal.    ED Results / Procedures / Treatments   Labs (all labs ordered are listed, but only abnormal results are displayed) Labs Reviewed  CBC  WITH DIFFERENTIAL/PLATELET - Abnormal; Notable for the following components:      Result Value   WBC 11.6 (*)    Platelets 401 (*)    Neutro Abs 7.9 (*)    All other components within normal limits  BASIC METABOLIC PANEL - Abnormal; Notable for the following components:   Glucose, Bld 125 (*)    All other components within normal limits  SEDIMENTATION RATE - Abnormal; Notable for the following components:   Sed Rate 51 (*)    All other components within normal limits  POC URINE PREG, ED     EKG     RADIOLOGY Please see ED Course for my review and interpretation.  I personally reviewed all radiographic images ordered to evaluate for the above acute complaints and reviewed radiology reports and findings.  These findings were personally discussed with the patient.  Please see medical record for radiology report.    PROCEDURES:  Critical Care performed:   Procedures   MEDICATIONS ORDERED IN ED: Medications  ketorolac (TORADOL) 30 MG/ML injection 15 mg (15 mg Intravenous Not Given 10/28/22 1839)  prochlorperazine (COMPAZINE) injection 10 mg (10 mg Intravenous  Given 10/28/22 1755)  diphenhydrAMINE (BENADRYL) injection 12.5 mg (12.5 mg Intravenous Given 10/28/22 1755)     IMPRESSION / MDM / ASSESSMENT AND PLAN / ED COURSE  I reviewed the triage vital signs and the nursing notes.                              Differential diagnosis includes, but is not limited to, cluster, tension, migraine, SAH, IPH, mass, occipital neuralgia,  Patient presenting to the ER for evaluation of symptoms as described above.  Based on symptoms, risk factors and considered above differential, this presenting complaint could reflect a potentially life-threatening illness therefore the patient will be placed on continuous pulse oximetry and telemetry for monitoring.  Laboratory evaluation will be sent to evaluate for the above complaints.   Will give migraine cocktail.  Does not have any focal  neurodeficits she is not febrile not meningitic.  Given history of bleed will order CT imaging.  CT imaging on my review and interpretation without evidence of acute findings.  Per radiology no bleed or acute abnormality.  Will observe and reassess for symptom improvement.   Clinical Course as of 10/28/22 1853  Sat Oct 28, 2022  1849 Patient reassessed.  Feels significantly improved and is actually requesting discharge at this point.  Her workup is reassuring.  Borderline elevation of ESR but not consistent with GCA.  Has a bit of a chronic leukocytosis.  His not consistent with meningitis or infectious process.  Her headache is completely resolved.  At this point believe she stable and appropriate for outpatient follow-up. [PR]    Clinical Course User Index [PR] Merlyn Lot, MD     FINAL CLINICAL IMPRESSION(S) / ED DIAGNOSES   Final diagnoses:  Acute nonintractable headache, unspecified headache type     Rx / DC Orders   ED Discharge Orders     None        Note:  This document was prepared using Dragon voice recognition software and may include unintentional dictation errors.    Merlyn Lot, MD 10/28/22 [phone]

## 2022-10-28 NOTE — ED Triage Notes (Signed)
Patient c/o right sided head pain that started about 10 min ago.  Patient denies N/V.  Patient had head injury and brain bleed in 2020.  Patient states that it does not feel like her typical migraine.

## 2022-10-28 NOTE — ED Notes (Signed)
Patient is being discharged from the Urgent Care and sent to the [facility] Emergency Department via private vehicle with spouse . Per Dr. Susa Simmonds, patient is in need of higher level of care due to severe headache and history of Brain bleed in 2020. Patient is aware and verbalizes understanding of plan of care.  Vitals:   10/28/22 1446  BP: (!) 140/99  Pulse: (!) 110  Resp: 14  Temp: 98 F (36.7 C)  SpO2: 99%

## 2022-10-28 NOTE — ED Triage Notes (Signed)
First Nurse Note;  Pt via [place] from [facility]. Pt was sent over for headache that started when she was shopping today. Pt has a hx of L sided head bleed after a fall in 2019. Pt is A&OX4 and NAD

## 2022-10-30 ENCOUNTER — Telehealth: Payer: Self-pay | Admitting: General Practice

## 2022-10-30 NOTE — Transitions of Care (Post Inpatient/ED Visit) (Signed)
   10/30/2022  Name: SHAWNY DORSETT MRN: [id number] DOB: 1996/03/12  Today's TOC FU Call Status: Today's TOC FU Call Status:: Unsuccessul Call (1st Attempt) Unsuccessful Call (1st Attempt) Date: 10/30/22  Attempted to reach the patient regarding the most recent Inpatient/ED visit.  Follow Up Plan: Additional outreach attempts will be made to reach the patient to complete the Transitions of Care (Post Inpatient/ED visit) call.   Signature Tinnie Gens, RN BSN

## 2022-11-02 NOTE — Transitions of Care (Post Inpatient/ED Visit) (Signed)
   11/02/2022  Name: Christina Wilson MRN: [id number] DOB: [birth year] Today's TOC FU Call Status: Today's TOC FU Call Status:: Unsuccessful Call (2nd Attempt) Unsuccessful Call (1st Attempt) Date: 10/30/22 Unsuccessful Call (2nd Attempt) Date: 11/02/22  Attempted to reach the patient regarding the most recent Inpatient/ED visit.  Follow Up Plan: Additional outreach attempts will be made to reach the patient to complete the Transitions of Care (Post Inpatient/ED visit) call.

## 2022-11-03 ENCOUNTER — Encounter: Payer: Self-pay | Admitting: Neurology

## 2022-11-03 ENCOUNTER — Ambulatory Visit: Payer: BC Managed Care – PPO | Admitting: Medical-Surgical

## 2022-11-03 ENCOUNTER — Encounter: Payer: Self-pay | Admitting: Medical-Surgical

## 2022-11-03 VITALS — BP 148/79 | HR 108 | Resp 20 | Ht 62.0 in | Wt 122.3 lb

## 2022-11-03 DIAGNOSIS — R519 Headache, unspecified: Secondary | ICD-10-CM | POA: Diagnosis not present

## 2022-11-03 MED ORDER — GABAPENTIN 100 MG PO CAPS: 100.0000 mg | ORAL_CAPSULE | Freq: Every day | ORAL | 0 refills | Status: DC | PRN

## 2022-11-03 NOTE — Transitions of Care (Post Inpatient/ED Visit) (Signed)
   11/03/2022  Name: Christina Wilson MRN: [id number] DOB: Feb 13, 1996  Today's TOC FU Call Status: Today's TOC FU Call Status:: Unsuccessful Call (3rd Attempt) Unsuccessful Call (1st Attempt) Date: 10/30/22 Unsuccessful Call (2nd Attempt) Date: 11/02/22 Unsuccessful Call (3rd Attempt) Date: 11/03/22  Attempted to reach the patient regarding the most recent Inpatient/ED visit.  Follow Up Plan: No further outreach attempts will be made at this time. We have been unable to contact the patient.  Signature Tinnie Gens, RN BSN

## 2022-11-03 NOTE — Progress Notes (Signed)
Established Patient Office Visit  Subjective   Patient ID: Christina Wilson, female   DOB: May 09, 1996 Age: 27 y.o. MRN: [id number]   Chief Complaint  Patient presents with   Headache   HPI Pleasant 27 year old female presenting today for evaluation of a new type of headache.  She does have a history of migraines however recently she has had 2 separate episodes of pain that is located on the right side of her head that does not radiate.  Notes that the pain is very intense and starts with a pressure that quickly becomes sharp and stabbing.  Her first episode was on 3/14 with a pain level rated at 8 out of 10.  This episode resolved completely within 3.5 hours.  On 3/16, she had a similar onset with a level 9/10 of pain.  She went to the ER for evaluation at that time.  They did a CT scan and additional workup which was all normal and reassuring.  That episode resolved after 4 to 5 hours.  She has had no further episodes of this pain.  Notes that these are not like her typical migraines and do not include photophobia, phonophobia, nausea, vomiting, or visual changes.  She has taken 1000 mg of Tylenol with 400 mg of ibuprofen each time which seemed to help resolve the pain.  She does have a history of head trauma but this was not felt to be related to that.  Presents today with no signs or symptoms of concern and no neurological changes.   Objective:    Vitals:   11/03/22 1102  BP: (!) 148/79  Pulse: (!) 108  Resp: 20  Height: 5\' 2"  (1.575 m)  Weight: 122 lb 4.8 oz (55.5 kg)  SpO2: 98%  BMI (Calculated): 22.36   Physical Exam Vitals reviewed.  Constitutional:      General: She is not in acute distress.    Appearance: Normal appearance. She is well-developed. She is not ill-appearing.  HENT:     Head: Normocephalic and atraumatic.  Cardiovascular:     Rate and Rhythm: Normal rate and regular rhythm.     Pulses: Normal pulses.     Heart sounds: Normal heart sounds.  Pulmonary:      Effort: Pulmonary effort is normal. No respiratory distress.     Breath sounds: Normal breath sounds. No wheezing, rhonchi or rales.  Skin:    General: Skin is warm and dry.  Neurological:     Mental Status: She is alert and oriented to person, place, and time.  Psychiatric:        Mood and Affect: Mood normal.        Behavior: Behavior normal.        Thought Content: Thought content normal.        Judgment: Judgment normal.   No results found for this or any previous visit (from the past 24 hour(s)).     The ASCVD Risk score (Arnett DK, et al., 2019) failed to calculate for the following reasons:   The 2019 ASCVD risk score is only valid for ages 65 to 31   Assessment & Plan:   1. Nonintractable episodic headache, unspecified headache type Unclear etiology.  With the distribution and description of the pain, suspect possible occipital neuralgia.  She previously took gabapentin which was helpful in the past.  Would like to try gabapentin 100-300 mg daily as needed to see if this will help if the pain returns.  In the meantime, we will  refer to neurology for further evaluation. - Ambulatory referral to Neurology  Return if symptoms worsen or fail to improve.  ___________________________________________ Clearnce Sorrel, DNP, APRN, FNP-BC [facility] [place]

## 2022-11-06 NOTE — Progress Notes (Unsigned)
NEUROLOGY CONSULTATION NOTE  ARMANDE LEYVAS MRN: [id number] DOB: 08/07/1996  Referring provider: Samuel Bouche, NP Primary care provider: Samuel Bouche, NP  Reason for consult:  headache  Assessment/Plan:   New onset headache - may be atypical presentation of primary stabbing headache.  Will check CTA of head to evaluate for aneurysm or other vascular etiology If it should recur, may take gabapentin as prescribed.  Will also prescribe indomethacin 25mg  to take if needed. If she should start having recurrent episodes, consider prescribing gabapentin as a preventative. Further recommendations pending results.   Subjective:  Christina Wilson is a 27 year old female with depression, anxiety, migraine, mitral valve prolapse and dysautonomia and history of traumatic brain injury who presents for headaches.  History supplemented by referring provider's note.   On 3/14, she was walking at work when she developed a pressure in a circumscribed area in the right temporal-parietal region that progressed to maximal intensity 8/10 sharp pain within one minute.  She took ibuprofen and acetaminophen and pain the sharp pain reduced to a pressure pain within one hour before subsequently resolving in 3 hours.    On 3/16, she had another episode, 9/10.  Similar semiology that resolved after a couple of hours.  Seen in ED  CT head  personally reviewed was unremarkable.  She hasn't had a recurrent episode since then but was prescribed gabapentin to take as needed by her PCP.  She does have occasional migraines which are bitemporal/occipital associated with nausea, photophobia and phonophobia, which occurs every other month.    She did sustain a head injury in June 2019 in which she fell into the cash register at work, striking the left side of her forehead and sustaining concussion with mild shear injury and parenchymal hemorrhage in the posterior left frontal lobe and nasal bone fracture.    Past  NSAIDS/analgesics:  ketorolac Past abortive triptans:  none Past abortive ergotamine:  none Past muscle relaxants:  baclofen Past anti-emetic:  none Past antihypertensive medications:  metoprolol Past antidepressant medications:  sertraline Past anticonvulsant medications:  Depakote, gabapentin Past anti-CGRP:  none Past vitamins/Herbal/Supplements:  none Past antihistamines/decongestants:  none Other past therapies:  none  Current NSAIDS/analgesics:  ibuprofen, acetaminophen Current triptans:  none Current ergotamine:  none Current anti-emetic:  none Current muscle relaxants:  none Current Antihypertensive medications:  none Current Antidepressant medications:  none Current Anticonvulsant medications:  none Current anti-CGRP:  none Current Vitamins/Herbal/Supplements:  MVI Current Antihistamines/Decongestants:  none Other therapy:  none Birth control:  none   PAST MEDICAL HISTORY: Past Medical History:  Diagnosis Date   Anxiety    Closed fracture of nasal bone 01/30/2018   Depression    Dysautonomia (HCC)    Dyshidrotic eczema    Dysmenorrhea    Migraine    MVP (mitral valve prolapse)    Tachycardia    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 20 tablet 1   gabapentin (NEURONTIN) 100 MG capsule Take 1-3 capsules (100-300 mg total) by mouth daily as needed (for headache). 30 capsule 0   metoprolol succinate (TOPROL-XL) 100 MG 24 hr tablet Take 1  tablet (100 mg total) by mouth daily. 90 tablet 1   Multiple Vitamin (MULTIVITAMIN ADULT PO) Take by mouth.     QUEtiapine (SEROQUEL) 25 MG  tablet Take 1 tablet (25 mg total) by mouth at bedtime. 90 tablet 1   sertraline (ZOLOFT) 100 MG tablet Take 1 tablet (100 mg total) by mouth daily. 90 tablet 1   No current facility-administered medications on file prior to visit.    ALLERGIES: No Known Allergies  FAMILY HISTORY: Family History  Problem Relation Age of Onset   Diabetes Mother    Mitral valve prolapse Mother    Stroke Mother    Atrial fibrillation Mother    Sleep apnea Mother    Depression Mother    Anxiety disorder Mother    Healthy Father     Objective:  Blood pressure 110/80, pulse 78, resp. rate 18, height 5\' 2"  (1.575 m), weight 124 lb (56.2 kg), last menstrual period 10/06/2022, SpO2 95 %. General: No acute distress.  Patient appears well-groomed.   Head:  Normocephalic/atraumatic Eyes:  fundi examined but not visualized Neck: supple, no paraspinal tenderness, full range of motion Back: No paraspinal tenderness Heart: regular rate and rhythm Lungs: Clear to auscultation bilaterally. Vascular: No carotid bruits. Neurological Exam: Mental status: alert and oriented to person, place, and time, speech fluent and not dysarthric, language intact. Cranial nerves: CN I: not tested CN II: pupils equal, round and reactive to light, visual fields intact CN III, IV, VI:  full range of motion, no nystagmus, no ptosis CN V: facial sensation intact. CN VII: upper and lower face symmetric CN VIII: hearing intact CN IX, X: gag intact, uvula midline CN XI: sternocleidomastoid and trapezius muscles intact CN XII: tongue midline Bulk & Tone: normal, no fasciculations. Motor:  muscle strength 5/5 throughout Sensation:  Temperature and vibratory sensation intact. Deep Tendon Reflexes:  2+ throughout,  toes downgoing.   Finger to nose testing:  Without dysmetria.   Gait:  Normal station and stride.  Romberg negative.    Thank you for allowing me to take part in the care of this patient.  Metta Clines, DO  CC:  Samuel Bouche, NP

## 2022-11-07 ENCOUNTER — Ambulatory Visit: Payer: BC Managed Care – PPO | Admitting: Neurology

## 2022-11-07 ENCOUNTER — Encounter: Payer: Self-pay | Admitting: Neurology

## 2022-11-07 VITALS — BP 110/80 | HR 78 | Resp 18 | Ht 62.0 in | Wt 124.0 lb

## 2022-11-07 DIAGNOSIS — G4485 Primary stabbing headache: Secondary | ICD-10-CM | POA: Diagnosis not present

## 2022-11-07 MED ORDER — INDOMETHACIN 25 MG PO CAPS: 25.0000 mg | ORAL_CAPSULE | Freq: Three times a day (TID) | ORAL | 0 refills | Status: AC | PRN

## 2022-11-07 NOTE — Patient Instructions (Signed)
Check CTA of head If the headache should recur, may use gabapentin or indomethacin. Further recommendations pending result.

## 2022-11-30 ENCOUNTER — Encounter: Payer: Self-pay | Admitting: Neurology

## 2022-12-05 ENCOUNTER — Encounter: Payer: Self-pay | Admitting: Neurology

## 2022-12-08 ENCOUNTER — Ambulatory Visit
Admission: RE | Admit: 2022-12-08 | Discharge: 2022-12-08 | Disposition: A | Discharge status: Home / Self Care | Payer: BC Managed Care – PPO | Source: Ambulatory Visit | Attending: Neurology | Admitting: Neurology

## 2022-12-08 MED ORDER — IOPAMIDOL (ISOVUE-370) INJECTION 76%
75.0000 mL | Freq: Once | INTRAVENOUS | Status: AC | PRN
  Administered 2022-12-08: 75 mL via INTRAVENOUS

## 2023-03-08 ENCOUNTER — Ambulatory Visit: Payer: BC Managed Care – PPO | Admitting: Neurology

## 2023-03-27 ENCOUNTER — Encounter: Payer: Self-pay | Admitting: Medical-Surgical
